# Patient Record
Sex: Female | Born: 1960 | Race: White | Hispanic: No | Marital: Married | Smoking: Never smoker
Health system: Southern US, Community
[De-identification: ages and names within clinical notes are randomized; demographics above are authoritative.]

## PROBLEM LIST (undated history)

## (undated) DIAGNOSIS — Q225 Ebstein's anomaly: Secondary | ICD-10-CM

## (undated) DIAGNOSIS — Z9889 Other specified postprocedural states: Secondary | ICD-10-CM

## (undated) DIAGNOSIS — A498 Other bacterial infections of unspecified site: Secondary | ICD-10-CM

## (undated) DIAGNOSIS — K589 Irritable bowel syndrome without diarrhea: Secondary | ICD-10-CM

## (undated) DIAGNOSIS — T8859XA Other complications of anesthesia, initial encounter: Secondary | ICD-10-CM

## (undated) DIAGNOSIS — E7800 Pure hypercholesterolemia, unspecified: Secondary | ICD-10-CM

## (undated) DIAGNOSIS — R112 Nausea with vomiting, unspecified: Secondary | ICD-10-CM

## (undated) DIAGNOSIS — I10 Essential (primary) hypertension: Secondary | ICD-10-CM

## (undated) DIAGNOSIS — N3010 Interstitial cystitis (chronic) without hematuria: Secondary | ICD-10-CM

## (undated) HISTORY — PX: HAND SURGERY: SHX662

## (undated) HISTORY — PX: ABDOMINAL HYSTERECTOMY: SHX81

## (undated) HISTORY — PX: APPENDECTOMY: SHX54

---

## 1997-12-22 ENCOUNTER — Other Ambulatory Visit: Admission: RE | Admit: 1997-12-22 | Discharge: 1997-12-22 | Payer: Self-pay | Admitting: Obstetrics and Gynecology

## 1998-07-26 ENCOUNTER — Encounter: Payer: Self-pay | Admitting: Emergency Medicine

## 1998-07-26 ENCOUNTER — Emergency Department (HOSPITAL_COMMUNITY): Admission: EM | Admit: 1998-07-26 | Discharge: 1998-07-26 | Payer: Self-pay | Admitting: Emergency Medicine

## 1998-07-26 ENCOUNTER — Inpatient Hospital Stay (HOSPITAL_COMMUNITY): Admission: RE | Admit: 1998-07-26 | Discharge: 1998-07-29 | Payer: Self-pay | Admitting: Urology

## 1998-07-27 ENCOUNTER — Encounter: Payer: Self-pay | Admitting: Urology

## 1998-07-28 ENCOUNTER — Encounter: Payer: Self-pay | Admitting: Urology

## 1998-07-29 ENCOUNTER — Encounter: Payer: Self-pay | Admitting: Urology

## 1998-08-08 ENCOUNTER — Inpatient Hospital Stay (HOSPITAL_COMMUNITY): Admission: EM | Admit: 1998-08-08 | Discharge: 1998-08-10 | Payer: Self-pay | Admitting: Emergency Medicine

## 1998-08-08 ENCOUNTER — Encounter: Payer: Self-pay | Admitting: Urology

## 1998-08-08 ENCOUNTER — Encounter: Payer: Self-pay | Admitting: Emergency Medicine

## 1998-08-09 ENCOUNTER — Encounter: Payer: Self-pay | Admitting: Urology

## 1998-08-14 ENCOUNTER — Ambulatory Visit (HOSPITAL_COMMUNITY): Admission: RE | Admit: 1998-08-14 | Discharge: 1998-08-14 | Payer: Self-pay | Admitting: Obstetrics and Gynecology

## 1999-04-18 ENCOUNTER — Other Ambulatory Visit: Admission: RE | Admit: 1999-04-18 | Discharge: 1999-04-18 | Payer: Self-pay | Admitting: Obstetrics and Gynecology

## 1999-05-27 ENCOUNTER — Encounter: Payer: Self-pay | Admitting: Urology

## 1999-05-27 ENCOUNTER — Encounter (INDEPENDENT_AMBULATORY_CARE_PROVIDER_SITE_OTHER): Payer: Self-pay | Admitting: Specialist

## 1999-05-27 ENCOUNTER — Ambulatory Visit (HOSPITAL_COMMUNITY): Admission: RE | Admit: 1999-05-27 | Discharge: 1999-05-27 | Payer: Self-pay | Admitting: Urology

## 2000-05-08 ENCOUNTER — Other Ambulatory Visit: Admission: RE | Admit: 2000-05-08 | Discharge: 2000-05-08 | Payer: Self-pay | Admitting: Obstetrics and Gynecology

## 2000-06-04 ENCOUNTER — Ambulatory Visit (HOSPITAL_BASED_OUTPATIENT_CLINIC_OR_DEPARTMENT_OTHER): Admission: RE | Admit: 2000-06-04 | Discharge: 2000-06-04 | Payer: Self-pay | Admitting: Surgery

## 2000-06-11 ENCOUNTER — Emergency Department (HOSPITAL_COMMUNITY): Admission: EM | Admit: 2000-06-11 | Discharge: 2000-06-11 | Payer: Self-pay | Admitting: *Deleted

## 2000-06-13 ENCOUNTER — Encounter: Payer: Self-pay | Admitting: Urology

## 2000-06-13 ENCOUNTER — Encounter: Admission: RE | Admit: 2000-06-13 | Discharge: 2000-06-13 | Payer: Self-pay | Admitting: Urology

## 2000-07-02 ENCOUNTER — Ambulatory Visit (HOSPITAL_COMMUNITY): Admission: RE | Admit: 2000-07-02 | Discharge: 2000-07-02 | Payer: Self-pay | Admitting: Gastroenterology

## 2000-08-09 ENCOUNTER — Ambulatory Visit (HOSPITAL_COMMUNITY): Admission: RE | Admit: 2000-08-09 | Discharge: 2000-08-09 | Payer: Self-pay | Admitting: Gastroenterology

## 2000-08-09 ENCOUNTER — Encounter: Payer: Self-pay | Admitting: Gastroenterology

## 2000-11-28 ENCOUNTER — Encounter: Admission: RE | Admit: 2000-11-28 | Discharge: 2000-11-28 | Payer: Self-pay | Admitting: Urology

## 2000-11-28 ENCOUNTER — Encounter: Payer: Self-pay | Admitting: Urology

## 2001-05-28 ENCOUNTER — Other Ambulatory Visit: Admission: RE | Admit: 2001-05-28 | Discharge: 2001-05-28 | Payer: Self-pay | Admitting: Obstetrics and Gynecology

## 2002-06-30 ENCOUNTER — Other Ambulatory Visit: Admission: RE | Admit: 2002-06-30 | Discharge: 2002-06-30 | Payer: Self-pay | Admitting: Obstetrics and Gynecology

## 2003-08-13 ENCOUNTER — Other Ambulatory Visit: Admission: RE | Admit: 2003-08-13 | Discharge: 2003-08-13 | Payer: Self-pay | Admitting: Obstetrics and Gynecology

## 2004-09-15 ENCOUNTER — Other Ambulatory Visit: Admission: RE | Admit: 2004-09-15 | Discharge: 2004-09-15 | Payer: Self-pay | Admitting: Obstetrics and Gynecology

## 2005-05-18 ENCOUNTER — Encounter: Admission: RE | Admit: 2005-05-18 | Discharge: 2005-05-18 | Payer: Self-pay | Admitting: Emergency Medicine

## 2005-05-30 ENCOUNTER — Emergency Department (HOSPITAL_COMMUNITY): Admission: EM | Admit: 2005-05-30 | Discharge: 2005-05-30 | Payer: Self-pay | Admitting: Emergency Medicine

## 2005-06-01 ENCOUNTER — Ambulatory Visit (HOSPITAL_COMMUNITY): Admission: RE | Admit: 2005-06-01 | Discharge: 2005-06-01 | Payer: Self-pay | Admitting: Emergency Medicine

## 2005-06-12 ENCOUNTER — Encounter: Admission: RE | Admit: 2005-06-12 | Discharge: 2005-06-12 | Payer: Self-pay | Admitting: Neurology

## 2008-09-01 ENCOUNTER — Encounter: Admission: RE | Admit: 2008-09-01 | Discharge: 2008-09-01 | Payer: Self-pay | Admitting: Urology

## 2009-03-20 ENCOUNTER — Emergency Department (HOSPITAL_COMMUNITY): Admission: EM | Admit: 2009-03-20 | Discharge: 2009-03-20 | Payer: Self-pay | Admitting: Emergency Medicine

## 2010-08-25 LAB — URINALYSIS, ROUTINE W REFLEX MICROSCOPIC
Bilirubin Urine: NEGATIVE
Glucose, UA: NEGATIVE mg/dL
Ketones, ur: NEGATIVE mg/dL
Nitrite: NEGATIVE
Specific Gravity, Urine: 1.014 (ref 1.005–1.030)
pH: 8 (ref 5.0–8.0)

## 2010-08-25 LAB — URINE CULTURE: Colony Count: >=100000

## 2010-08-25 LAB — BASIC METABOLIC PANEL
BUN: 9 mg/dL (ref 6–23)
CO2: 25 mEq/L (ref 19–32)
Chloride: 108 mEq/L (ref 96–112)
Creatinine, Ser: 0.82 mg/dL (ref 0.4–1.2)
Potassium: 3.9 mEq/L (ref 3.5–5.1)

## 2010-08-25 LAB — DIFFERENTIAL
Basophils Relative: 0 % (ref 0–1)
Eosinophils Absolute: 0 10*3/uL (ref 0.0–0.7)
Eosinophils Relative: 0 % (ref 0–5)
Lymphs Abs: 0.3 10*3/uL — ABNORMAL LOW (ref 0.7–4.0)
Monocytes Relative: 7 % (ref 3–12)

## 2010-08-25 LAB — CBC
HCT: 32.6 % — ABNORMAL LOW (ref 36.0–46.0)
MCHC: 33 g/dL (ref 30.0–36.0)
MCV: 89.4 fL (ref 78.0–100.0)
Platelets: 95 10*3/uL — ABNORMAL LOW (ref 150–400)
WBC: 6.4 10*3/uL (ref 4.0–10.5)

## 2010-08-25 LAB — URINE MICROSCOPIC-ADD ON

## 2010-10-07 NOTE — Op Note (Signed)
[place]  Patient:    Christina Singleton, Christina Singleton                      MRN: [id number] Proc. Date: 06/04/00 Adm. Date:  06/04/00 Attending:  Abigail Miyamoto, M.D.                           Operative Report  PREOPERATIVE DIAGNOSIS:  Nodule right axillae.  POSTOPERATIVE DIAGNOSIS:  Dilated vein right axillae.  PROCEDURE:  Exploration of nodule right axillae.  SURGEON:  Dr. Abigail Miyamoto.  ANESTHESIA:  1% lidocaine and monitored anesthesia care.  ESTIMATED BLOOD LOSS:  Minimal.  INDICATIONS:  Christina Singleton is a 50 year old female who presented with a nodule in the axillae, which was more on the very proximal medial right arm. The mass was very superficial and soft, and could only be seen with the patient sitting upright and not when she was laying down.  She requested removal of this nodule.  FINDINGS:  The patient was found instead of a nodule to have a dilated vein which was left in place.  I could not be certain whether this was a communicating vein or a deep vein giving its location in the axillae.  PROCEDURE IN DETAIL:  The patient was brought to the operating room and identified at [employer].  She was placed supine on the operating room table and anesthesia was induced.  Her right axillae was then prepped and draped in the usual sterile fashion.  The skin overlying the nodule was then anesthetized with 1% lidocaine.  Incision was carried down to the nodule with the scalpel.  The nodule was then examined closer with the aid of dissection with a hemostat.  It then became apparent that this was a small dilated vein. No other abnormalities could be palpated through the incision.  At this point decision was made to leave the vein in place.  Subcutaneous layer was then closed with an interrupted 3-0 Vicryl suture, and the skin was closed with a running 4-0 monacryl.  Steri-Strips, gauze, and tape were then applied.  The patient tolerated  the procedure well.  All sponge, needle and instrument counts were correct at the end of the procedure.  The patient was taken in stable condition from the operating room to the recovery room. DD:  06/04/00 TD:  06/04/00 Job: [id number]

## 2010-10-11 ENCOUNTER — Other Ambulatory Visit: Payer: Self-pay | Admitting: Obstetrics and Gynecology

## 2010-10-11 DIAGNOSIS — R928 Other abnormal and inconclusive findings on diagnostic imaging of breast: Secondary | ICD-10-CM

## 2010-10-14 ENCOUNTER — Ambulatory Visit
Admission: RE | Admit: 2010-10-14 | Discharge: 2010-10-14 | Disposition: A | Discharge status: Home / Self Care | Payer: BC Managed Care – PPO | Source: Ambulatory Visit | Attending: Obstetrics and Gynecology | Admitting: Obstetrics and Gynecology

## 2010-10-14 DIAGNOSIS — R928 Other abnormal and inconclusive findings on diagnostic imaging of breast: Secondary | ICD-10-CM

## 2011-02-16 ENCOUNTER — Emergency Department (HOSPITAL_COMMUNITY)
Admission: EM | Admit: 2011-02-16 | Discharge: 2011-02-17 | Disposition: A | Discharge status: Home / Self Care | Payer: BC Managed Care – PPO | Attending: Emergency Medicine | Admitting: Emergency Medicine

## 2011-02-16 DIAGNOSIS — R079 Chest pain, unspecified: Secondary | ICD-10-CM | POA: Insufficient documentation

## 2011-02-16 DIAGNOSIS — R112 Nausea with vomiting, unspecified: Secondary | ICD-10-CM | POA: Insufficient documentation

## 2011-02-16 DIAGNOSIS — N390 Urinary tract infection, site not specified: Secondary | ICD-10-CM | POA: Insufficient documentation

## 2011-02-16 DIAGNOSIS — E860 Dehydration: Secondary | ICD-10-CM | POA: Insufficient documentation

## 2011-02-16 DIAGNOSIS — I10 Essential (primary) hypertension: Secondary | ICD-10-CM | POA: Insufficient documentation

## 2011-02-16 DIAGNOSIS — R51 Headache: Secondary | ICD-10-CM | POA: Insufficient documentation

## 2011-02-17 ENCOUNTER — Emergency Department (HOSPITAL_COMMUNITY): Payer: BC Managed Care – PPO

## 2011-02-17 ENCOUNTER — Encounter (HOSPITAL_COMMUNITY): Payer: Self-pay

## 2011-02-17 LAB — URINALYSIS, ROUTINE W REFLEX MICROSCOPIC
Glucose, UA: NEGATIVE mg/dL
Protein, ur: NEGATIVE mg/dL
Specific Gravity, Urine: 1.016 (ref 1.005–1.030)
pH: 7.5 (ref 5.0–8.0)

## 2011-02-17 LAB — COMPREHENSIVE METABOLIC PANEL
Albumin: 3.7 g/dL (ref 3.5–5.2)
BUN: 10 mg/dL (ref 6–23)
Calcium: 9.4 mg/dL (ref 8.4–10.5)
Creatinine, Ser: 0.69 mg/dL (ref 0.50–1.10)
Total Bilirubin: 0.1 mg/dL — ABNORMAL LOW (ref 0.3–1.2)
Total Protein: 6.9 g/dL (ref 6.0–8.3)

## 2011-02-17 LAB — DIFFERENTIAL
Eosinophils Absolute: 0 10*3/uL (ref 0.0–0.7)
Eosinophils Relative: 0 % (ref 0–5)
Lymphocytes Relative: 13 % (ref 12–46)
Lymphs Abs: 1.6 10*3/uL (ref 0.7–4.0)
Monocytes Absolute: 0.7 10*3/uL (ref 0.1–1.0)

## 2011-02-17 LAB — CBC
HCT: 38.5 % (ref 36.0–46.0)
MCH: 29.2 pg (ref 26.0–34.0)
MCHC: 34 g/dL (ref 30.0–36.0)
MCV: 85.9 fL (ref 78.0–100.0)
RDW: 14.9 % (ref 11.5–15.5)

## 2011-02-17 LAB — URINE MICROSCOPIC-ADD ON

## 2011-04-22 ENCOUNTER — Encounter (HOSPITAL_COMMUNITY): Payer: Self-pay | Admitting: *Deleted

## 2011-04-22 ENCOUNTER — Emergency Department (HOSPITAL_COMMUNITY): Payer: BC Managed Care – PPO

## 2011-04-22 ENCOUNTER — Emergency Department (HOSPITAL_COMMUNITY)
Admission: EM | Admit: 2011-04-22 | Discharge: 2011-04-22 | Disposition: A | Discharge status: Other Acute Inpatient Hospital | Payer: BC Managed Care – PPO

## 2011-04-22 ENCOUNTER — Inpatient Hospital Stay (HOSPITAL_COMMUNITY)
Admission: EM | Admit: 2011-04-22 | Discharge: 2011-04-25 | DRG: 229 | Disposition: A | Discharge status: Home / Self Care | Payer: BC Managed Care – PPO | Source: Ambulatory Visit | Attending: General Surgery | Admitting: General Surgery

## 2011-04-22 DIAGNOSIS — L03119 Cellulitis of unspecified part of limb: Secondary | ICD-10-CM | POA: Diagnosis present

## 2011-04-22 DIAGNOSIS — J45909 Unspecified asthma, uncomplicated: Secondary | ICD-10-CM | POA: Diagnosis present

## 2011-04-22 DIAGNOSIS — L02519 Cutaneous abscess of unspecified hand: Secondary | ICD-10-CM | POA: Diagnosis present

## 2011-04-22 DIAGNOSIS — M65839 Other synovitis and tenosynovitis, unspecified forearm: Principal | ICD-10-CM | POA: Diagnosis present

## 2011-04-22 DIAGNOSIS — Z888 Allergy status to other drugs, medicaments and biological substances status: Secondary | ICD-10-CM

## 2011-04-22 DIAGNOSIS — I10 Essential (primary) hypertension: Secondary | ICD-10-CM | POA: Diagnosis present

## 2011-04-22 DIAGNOSIS — S61219A Laceration without foreign body of unspecified finger without damage to nail, initial encounter: Secondary | ICD-10-CM

## 2011-04-22 DIAGNOSIS — Z882 Allergy status to sulfonamides status: Secondary | ICD-10-CM

## 2011-04-22 HISTORY — DX: Pure hypercholesterolemia, unspecified: E78.00

## 2011-04-22 HISTORY — DX: Irritable bowel syndrome, unspecified: K58.9

## 2011-04-22 HISTORY — DX: Other bacterial infections of unspecified site: A49.8

## 2011-04-22 HISTORY — DX: Ebstein's anomaly: Q22.5

## 2011-04-22 HISTORY — DX: Essential (primary) hypertension: I10

## 2011-04-22 LAB — LACTIC ACID, PLASMA: Lactic Acid, Venous: 1.5 mmol/L (ref 0.5–2.2)

## 2011-04-22 LAB — CBC
Hemoglobin: 13.1 g/dL (ref 12.0–15.0)
MCHC: 33.9 g/dL (ref 30.0–36.0)
Platelets: 147 10*3/uL — ABNORMAL LOW (ref 150–400)
RDW: 15.3 % (ref 11.5–15.5)

## 2011-04-22 LAB — BASIC METABOLIC PANEL
Chloride: 101 mEq/L (ref 96–112)
GFR calc Af Amer: 83 mL/min — ABNORMAL LOW (ref >90)
Potassium: 3.7 mEq/L (ref 3.5–5.1)
Sodium: 138 mEq/L (ref 135–145)

## 2011-04-22 LAB — DIFFERENTIAL
Basophils Absolute: 0 10*3/uL (ref 0.0–0.1)
Basophils Relative: 0 % (ref 0–1)
Neutro Abs: 11.4 10*3/uL — ABNORMAL HIGH (ref 1.7–7.7)
Neutrophils Relative %: 92 % — ABNORMAL HIGH (ref 43–77)

## 2011-04-22 MED ORDER — ONDANSETRON HCL 4 MG/2ML IJ SOLN
4.0000 mg | Freq: Once | INTRAMUSCULAR | Status: AC
  Administered 2011-04-22: 4 mg via INTRAVENOUS
  Filled 2011-04-22: qty 2

## 2011-04-22 MED ORDER — AMLODIPINE BESYLATE 10 MG PO TABS
10.0000 mg | ORAL_TABLET | Freq: Every day | ORAL | Status: DC
  Administered 2011-04-24 – 2011-04-25 (×2): 10 mg via ORAL
  Filled 2011-04-22 (×3): qty 1

## 2011-04-22 MED ORDER — ONDANSETRON HCL 4 MG/2ML IJ SOLN
4.0000 mg | Freq: Once | INTRAMUSCULAR | Status: AC
  Administered 2011-04-22: 4 mg via INTRAVENOUS

## 2011-04-22 MED ORDER — PANTOPRAZOLE SODIUM 40 MG PO TBEC
40.0000 mg | DELAYED_RELEASE_TABLET | Freq: Every day | ORAL | Status: DC
  Administered 2011-04-24 – 2011-04-25 (×2): 40 mg via ORAL
  Filled 2011-04-22: qty 1

## 2011-04-22 MED ORDER — ALBUTEROL SULFATE HFA 108 (90 BASE) MCG/ACT IN AERS: 2.0000 | INHALATION_SPRAY | Freq: Four times a day (QID) | RESPIRATORY_TRACT | Status: DC | PRN

## 2011-04-22 MED ORDER — KETOROLAC TROMETHAMINE 30 MG/ML IJ SOLN
INTRAMUSCULAR | Status: AC
  Administered 2011-04-22: 30 mg via INTRAVENOUS
  Filled 2011-04-22: qty 1

## 2011-04-22 MED ORDER — VANCOMYCIN HCL 1000 MG IV SOLR
750.0000 mg | INTRAVENOUS | Status: AC
  Administered 2011-04-22: 750 mg via INTRAVENOUS
  Filled 2011-04-22: qty 750

## 2011-04-22 MED ORDER — DIPHENHYDRAMINE HCL 25 MG PO TABS
25.0000 mg | ORAL_TABLET | ORAL | Status: DC
  Administered 2011-04-22 – 2011-04-25 (×8): 25 mg via ORAL
  Filled 2011-04-22 (×25): qty 1

## 2011-04-22 MED ORDER — KETOROLAC TROMETHAMINE 30 MG/ML IJ SOLN
30.0000 mg | Freq: Once | INTRAMUSCULAR | Status: AC
  Administered 2011-04-22: 30 mg via INTRAVENOUS
  Filled 2011-04-22: qty 1

## 2011-04-22 MED ORDER — FLUOXETINE HCL 40 MG PO CAPS: 40.0000 mg | ORAL_CAPSULE | Freq: Every day | ORAL | Status: DC

## 2011-04-22 MED ORDER — KCL IN DEXTROSE-NACL 20-5-0.45 MEQ/L-%-% IV SOLN
INTRAVENOUS | Status: DC
  Administered 2011-04-22: 20:00:00 via INTRAVENOUS
  Administered 2011-04-23: 20 mL/h via INTRAVENOUS
  Filled 2011-04-22 (×3): qty 1000

## 2011-04-22 MED ORDER — SODIUM CHLORIDE 0.9 % IV BOLUS (SEPSIS)
1000.0000 mL | Freq: Once | INTRAVENOUS | Status: AC
  Administered 2011-04-22: 1000 mL via INTRAVENOUS

## 2011-04-22 MED ORDER — ONDANSETRON HCL 4 MG/2ML IJ SOLN
4.0000 mg | Freq: Four times a day (QID) | INTRAMUSCULAR | Status: DC | PRN
  Filled 2011-04-22: qty 2

## 2011-04-22 MED ORDER — KETOROLAC TROMETHAMINE 30 MG/ML IJ SOLN
30.0000 mg | Freq: Once | INTRAMUSCULAR | Status: AC
  Administered 2011-04-22: 30 mg via INTRAVENOUS

## 2011-04-22 MED ORDER — HYDROCODONE-ACETAMINOPHEN 5-325 MG PO TABS
1.0000 | ORAL_TABLET | ORAL | Status: DC | PRN
  Filled 2011-04-22: qty 2

## 2011-04-22 MED ORDER — ONDANSETRON HCL 4 MG/2ML IJ SOLN
INTRAMUSCULAR | Status: AC
  Administered 2011-04-22: 4 mg via INTRAVENOUS
  Filled 2011-04-22: qty 2

## 2011-04-22 MED ORDER — BUPROPION HCL ER (SR) 150 MG PO TB12
150.0000 mg | ORAL_TABLET | Freq: Two times a day (BID) | ORAL | Status: DC
  Administered 2011-04-24 – 2011-04-25 (×3): 150 mg via ORAL
  Filled 2011-04-22 (×6): qty 1

## 2011-04-22 MED ORDER — SODIUM CHLORIDE 0.9 % IV SOLN: Freq: Once | INTRAVENOUS | Status: AC

## 2011-04-22 MED ORDER — HYDROCODONE-ACETAMINOPHEN 7.5-325 MG PO TABS: 1.0000 | ORAL_TABLET | ORAL | Status: DC | PRN

## 2011-04-22 MED ORDER — FLUOXETINE HCL 20 MG PO CAPS
40.0000 mg | ORAL_CAPSULE | Freq: Every day | ORAL | Status: DC
  Administered 2011-04-24 – 2011-04-25 (×2): 40 mg via ORAL
  Filled 2011-04-22 (×4): qty 2

## 2011-04-22 MED ORDER — ESTRADIOL 1 MG PO TABS
1.0000 mg | ORAL_TABLET | Freq: Every day | ORAL | Status: DC
  Administered 2011-04-24 – 2011-04-25 (×2): 1 mg via ORAL
  Filled 2011-04-22 (×3): qty 1

## 2011-04-22 MED ORDER — MONTELUKAST SODIUM 10 MG PO TABS
10.0000 mg | ORAL_TABLET | Freq: Every day | ORAL | Status: DC
  Administered 2011-04-22 – 2011-04-24 (×4): 10 mg via ORAL
  Filled 2011-04-22 (×4): qty 1

## 2011-04-22 MED ORDER — SODIUM CHLORIDE 0.9 % IV SOLN
750.0000 mg | Freq: Two times a day (BID) | INTRAVENOUS | Status: DC
  Filled 2011-04-22: qty 750

## 2011-04-22 NOTE — ED Notes (Signed)
Pt noted w/good sensation to fingers to left hand - bandage noted to hand has remained intact - no drainage noted

## 2011-04-22 NOTE — ED Notes (Signed)
Pt states she cut her hand on a plate on Thursday now she is having severe pain and fingers are swelled up.  Swelling just started this am pt reports.

## 2011-04-22 NOTE — ED Provider Notes (Signed)
History     CSN: [id number] Arrival date & time: 04/22/2011  2:04 PM   First MD Initiated Contact with Patient 04/22/11 1439      Chief Complaint  Patient presents with  . Nausea  . Weakness  . Emesis  . Joint Swelling  . Hand Pain    (Consider location/radiation/quality/duration/timing/severity/associated sxs/prior treatment) HPI 50 year old otherwise healthy woman who presents with name and swelling over the left index finger.  Pt cut the finger picking up a broken plate on Thursday evening.  Felt fine Friday with good ROM and no pain, but awoke this a.m. with redness, swelling, and pain on the dorsal and ventral surfaces of the hand.  She has been feeling cold and has vomited three times.  She is in significant pain. She went initially to urgent care, who advised her to come to the emergency room for evaluation.  Past Medical History  Diagnosis Date  . IBS (irritable bowel syndrome)   . Hypertension   . Ebsteins anomaly   . Migraine   . 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 Hypercholesteremia   . Asthma     Past Surgical History  Procedure Date  . Appendectomy   . Abdominal hysterectomy     History reviewed. No pertinent family history.  History  Substance Use Topics  . Smoking status: Never Smoker   . Smokeless tobacco: Not on file  . Alcohol Use: 0.6 oz/week    1 Glasses of wine per week    OB History    Grav Para Term Preterm Abortions TAB SAB Ect Mult Living                  Review of Systems  Allergies  Sulfa antibiotics and Zithromax  Home Medications  No current outpatient prescriptions on file.  BP 86/54  Pulse 60  Temp(Src) 99.9 F (37.7 C) (Oral)  Resp 20  SpO2 100%  Physical Exam VItal signs reviewed and stable. GEN: anxious appearing, jittery L Hand: erythema, edema, and warmth centered around the L index finger.  Very TTP and motion.  Laceration 3-55mm observed on ventral surface at MCP.  ED Course    Procedures (including critical care time)   Labs Reviewed  CBC  DIFFERENTIAL  BASIC METABOLIC PANEL   No results found.   No diagnosis found.    MDM  50 yo c acute cellulitis of L hand 2/2 small laceration.  Concern for tendon damage vs tendon shealth infection.  X ray pending, fluids, abx, zofran/torodol ordered.  Dr. Izora Ribas, on call for hand surg, consulted and at bedside.  Wants to perform I/D in ED, to OR if unsuccessful.        Kathreen Cosier, MD 04/22/11 [phone]

## 2011-04-22 NOTE — ED Notes (Signed)
Pt states does not want narcotic meds.  States would rather have Toradol.  States is not allergic but states "I do not like the way my body reacts to them."   Also, this RN has spoken with pharmacy x 3 re: pt's meds and IV fluids - continuing to wait.

## 2011-04-22 NOTE — Progress Notes (Signed)
ANTIBIOTIC CONSULT NOTE - INITIAL  Pharmacy Consult for vancomycin Indication: cellulitis  Allergies  Allergen Reactions  . Sulfa Antibiotics Other (See Comments)    hallucinations  . Zithromax (Azithromycin) Other (See Comments)    Made her feel weird    Patient Measurements: Height: 5' 10.5" (179.1 cm) Weight: 140 lb (63.504 kg) IBW/kg (Calculated) : 69.65   Vital Signs: Temp: 99.9 F (37.7 C) (12/01 1404) Temp src: Oral (12/01 1404) BP: 120/69 mmHg (12/01 1525) Pulse Rate: 66  (12/01 1525)  Labs:  Basename 04/22/11 1456  WBC 12.5*  HGB 13.1  PLT 147*  LABCREA --  CREATININE 0.91   Estimated Creatinine Clearance: 73.3 ml/min (by C-G formula based on Cr of 0.91).   Medical History: Past Medical History  Diagnosis Date  . IBS (irritable bowel syndrome)   . Hypertension   . Ebsteins anomaly   . Migraine   . 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 Hypercholesteremia   . Asthma     Assessment: 50 yo female with cellulitis of L hand and to start on. vancomycin   Goal of Therapy:  Vancomycin trough level 10-15 mcg/ml  Plan Will give vancomycin 750mg  IV q12hr and follow renal function and order levels as needed.  Benny Lennert 04/22/2011,4:07 PM

## 2011-04-22 NOTE — H&P (Signed)
Christina Singleton is an 50 y.o. female.   Chief Complaint: I cut my hand HPI: cut left palm on Thursday, this am hand more painful, swollen, has been using abx ointment at home  Past Medical History  Diagnosis Date  . IBS (irritable bowel syndrome)   . Hypertension   . Ebsteins anomaly   . Migraine   . 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 Hypercholesteremia   . Asthma     Past Surgical History  Procedure Date  . Appendectomy   . Abdominal hysterectomy     History reviewed. No pertinent family history. Social History:  reports that she has never smoked. She does not have any smokeless tobacco history on file. She reports that she drinks about .6 ounces of alcohol per week. She reports that she does not use illicit drugs.  Allergies:  Allergies  Allergen Reactions  . Sulfa Antibiotics Other (See Comments)    hallucinations  . Zithromax (Azithromycin) Other (See Comments)    Made her feel weird    Medications Prior to Admission  Medication Dose Route Frequency Provider Last Rate Last Dose  . sodium chloride 0.9 % bolus 1,000 mL  1,000 mL Intravenous Once Kathreen Cosier, MD   1,000 mL at 04/22/11 1509   Followed by  . 0.9 %  sodium chloride infusion   Intravenous Once Kathreen Cosier, MD      . ketorolac (TORADOL) 30 MG/ML injection 30 mg  30 mg Intravenous Once Kathreen Cosier, MD   30 mg at 04/22/11 1507  . ondansetron (ZOFRAN) injection 4 mg  4 mg Intravenous Once Kathreen Cosier, MD   4 mg at 04/22/11 1506   Followed by  . ondansetron (ZOFRAN) injection 4 mg  4 mg Intravenous Q6H PRN Kathreen Cosier, MD      . vancomycin (VANCOCIN) 750 mg in sodium chloride 0.9 % 150 mL IVPB  750 mg Intravenous NOW Benny Lennert, PHARMD      . vancomycin (VANCOCIN) 750 mg in sodium chloride 0.9 % 150 mL IVPB  750 mg Intravenous Q12H Benny Lennert, [facility]       No current outpatient prescriptions on file as of 04/22/2011.     Results for orders placed during the hospital encounter of 04/22/11 (from the past 48 hour(s))  CBC     Status: Abnormal   Collection Time   04/22/11  2:56 PM      Component Value Range Comment   WBC 12.5 (*) 4.0 - 10.5 (K/uL)    RBC 4.60  3.87 - 5.11 (MIL/uL)    Hemoglobin 13.1  12.0 - 15.0 (g/dL)    HCT 96.0  45.4 - 09.8 (%)    MCV 83.9  78.0 - 100.0 (fL)    MCH 28.5  26.0 - 34.0 (pg)    MCHC 33.9  30.0 - 36.0 (g/dL)    RDW 11.9  14.7 - 82.9 (%)    Platelets 147 (*) 150 - 400 (K/uL)   DIFFERENTIAL     Status: Abnormal   Collection Time   04/22/11  2:56 PM      Component Value Range Comment   Neutrophils Relative 92 (*) 43 - 77 (%)    Neutro Abs 11.4 (*) 1.7 - 7.7 (K/uL)    Lymphocytes Relative 4 (*) 12 - 46 (%)    Lymphs Abs 0.5 (*) 0.7 - 4.0 (K/uL)    Monocytes Relative 4  3 - 12 (%)    Monocytes Absolute 0.6  0.1 - 1.0 (K/uL)    Eosinophils Relative 0  0 - 5 (%)    Eosinophils Absolute 0.0  0.0 - 0.7 (K/uL)    Basophils Relative 0  0 - 1 (%)    Basophils Absolute 0.0  0.0 - 0.1 (K/uL)   BASIC METABOLIC PANEL     Status: Abnormal   Collection Time   04/22/11  2:56 PM      Component Value Range Comment   Sodium 138  135 - 145 (mEq/L)    Potassium 3.7  3.5 - 5.1 (mEq/L)    Chloride 101  96 - 112 (mEq/L)    CO2 25  19 - 32 (mEq/L)    Glucose, Bld 95  70 - 99 (mg/dL)    BUN 11  6 - 23 (mg/dL)    Creatinine, Ser 1.61  0.50 - 1.10 (mg/dL)    Calcium 9.5  8.4 - 10.5 (mg/dL)    GFR calc non Af Amer 72 (*) >90 (mL/min)    GFR calc Af Amer 83 (*) >90 (mL/min)   LACTIC ACID, PLASMA     Status: Normal   Collection Time   04/22/11  2:57 PM      Component Value Range Comment   Lactic Acid, Venous 1.5  0.5 - 2.2 (mmol/L)    No results found.  Review of Systems  Constitutional: Positive for fever and chills.  Eyes: Negative.   Respiratory: Negative.   Cardiovascular: Negative.   Gastrointestinal: Positive for nausea.  Genitourinary: Negative.   Musculoskeletal: Positive  for joint pain.  Skin: Positive for rash.  Neurological: Negative.   Endo/Heme/Allergies: Negative.   Psychiatric/Behavioral: Negative.     Blood pressure 120/69, pulse 66, temperature 99.9 F (37.7 C), temperature source Oral, resp. rate 20, height 5' 10.5" (1.791 m), weight 63.504 kg (140 lb), SpO2 99.00%. Physical Exam  Constitutional: She is oriented to person, place, and time. She appears well-developed and well-nourished.  HENT:  Head: Normocephalic and atraumatic.  Eyes: Conjunctivae are normal.  Neck: Neck supple.  Cardiovascular: Normal rate.   Respiratory: Effort normal.  GI: Soft.  Musculoskeletal: She exhibits edema and tenderness.       Arms: Neurological: She is alert and oriented to person, place, and time.  Skin: There is erythema.  Psychiatric: She has a normal mood and affect.     Assessment/Plan Laceration L hand, cellulitis, ? Early tenosynovitis  I&D performed , irrigation of wound, packing with iodoform gauze Will observe for 23h, remove packing in am, cont of Iv abx, may require additional i&D  Mauricio Dahlen CHRISTOPHER 04/22/2011, 4:21 PM

## 2011-04-22 NOTE — ED Provider Notes (Signed)
I saw and evaluated the patient, reviewed the resident's note and I agree with the findings and plan.   .Face to face Exam:  General:  Awake HEENT:  Atraumatic Resp:  Normal effort Abd:  Nondistended Neuro:No focal weakness Lymph: No adenopathy   Nelia Shi, MD 04/22/11 2026

## 2011-04-22 NOTE — ED Notes (Signed)
Spoke w/pharmacy re: pt's meds - advised pt will be staying in CDU tonight so meds need to be verified and sent.

## 2011-04-22 NOTE — Brief Op Note (Signed)
*   No surgery found *  4:28 PM  PATIENT:  Christina Singleton  50 y.o. female  PRE-OPERATIVE DIAGNOSIS:  Cellulitis L Hand, early tenosynovitis  POST-OPERATIVE DIAGNOSIS:  same  PROCEDURE:  I&D L palm, L4th digit, packing w/ Iodoform gauze  SURGEON:  Tulio Facundo  PHYSICIAN ASSISTANT: none ASSISTANTS: none   ANESTHESIA:   local  EBL:     BLOOD ADMINISTERED:none  DRAINS: iodoform   LOCAL MEDICATIONS USED:  LIDOCAINE 5CC  SPECIMEN:  No Specimen  DISPOSITION OF SPECIMEN:  N/A  COUNTS:  YES  TOURNIQUET:  * No surgery found *  DICTATION: .Note written in EPIC  PLAN OF CARE: Admit for overnight observation  PATIENT DISPOSITION:  cdu   Delay start of Pharmacological VTE agent (>24hrs) due to surgical blood loss or risk of bleeding:  {YES/NO/NOT APPLICABLE:20182

## 2011-04-22 NOTE — ED Provider Notes (Signed)
Dr. Izora Ribas I and D pts hand.   Pt is to stay in CDU observation overnight following the order set written by Dr. Izora Ribas. Dr. Izora Ribas will come see patient in the morning 04/23/2011, and decide if the patient will go home or if he will admit.   Dorthula Matas, PA 04/22/11 1640  Dorthula Matas, PA 04/22/11 [phone]

## 2011-04-22 NOTE — ED Notes (Signed)
Pt had hand laceration from plate on Thur.  Pt has minimal size cut.  Pt states that she woke up today with increase in pain and swelling to same. Pt then had onset of n/v.  Pt has moderate swelling and redness.  Pt appears pale in color at this time.

## 2011-04-23 ENCOUNTER — Inpatient Hospital Stay: Admit: 2011-04-23 | Payer: Self-pay | Admitting: General Surgery

## 2011-04-23 ENCOUNTER — Encounter (HOSPITAL_COMMUNITY): Payer: Self-pay

## 2011-04-23 ENCOUNTER — Emergency Department (HOSPITAL_COMMUNITY): Payer: BC Managed Care – PPO

## 2011-04-23 ENCOUNTER — Encounter (HOSPITAL_COMMUNITY)
Admission: EM | Disposition: A | Discharge status: Home / Self Care | Payer: Self-pay | Source: Ambulatory Visit | Attending: General Surgery

## 2011-04-23 HISTORY — PX: I & D EXTREMITY: SHX5045

## 2011-04-23 LAB — DIFFERENTIAL
Basophils Absolute: 0 10*3/uL (ref 0.0–0.1)
Basophils Relative: 0 % (ref 0–1)
Monocytes Absolute: 0.9 10*3/uL (ref 0.1–1.0)
Neutro Abs: 10.6 10*3/uL — ABNORMAL HIGH (ref 1.7–7.7)
Neutrophils Relative %: 87 % — ABNORMAL HIGH (ref 43–77)

## 2011-04-23 LAB — CBC
HCT: 33.7 % — ABNORMAL LOW (ref 36.0–46.0)
MCHC: 32.6 g/dL (ref 30.0–36.0)
RDW: 15.7 % — ABNORMAL HIGH (ref 11.5–15.5)

## 2011-04-23 SURGERY — IRRIGATION AND DEBRIDEMENT EXTREMITY
Anesthesia: General | Site: Hand | Laterality: Left | Wound class: Clean

## 2011-04-23 MED ORDER — GLYCOPYRROLATE 0.2 MG/ML IJ SOLN
INTRAMUSCULAR | Status: DC | PRN
  Administered 2011-04-23: 0.2 mg via INTRAVENOUS

## 2011-04-23 MED ORDER — CLINDAMYCIN PHOSPHATE 600 MG/50ML IV SOLN
600.0000 mg | Freq: Three times a day (TID) | INTRAVENOUS | Status: DC
  Administered 2011-04-23 – 2011-04-25 (×6): 600 mg via INTRAVENOUS
  Filled 2011-04-23 (×12): qty 50

## 2011-04-23 MED ORDER — INFLUENZA VIRUS VACC SPLIT PF IM SUSP
0.5000 mL | INTRAMUSCULAR | Status: DC
  Filled 2011-04-23: qty 0.5

## 2011-04-23 MED ORDER — PNEUMOCOCCAL VAC POLYVALENT 25 MCG/0.5ML IJ INJ
0.5000 mL | INJECTION | INTRAMUSCULAR | Status: DC
  Filled 2011-04-23: qty 0.5

## 2011-04-23 MED ORDER — HYDROMORPHONE HCL PF 1 MG/ML IJ SOLN
0.2500 mg | INTRAMUSCULAR | Status: DC | PRN
  Administered 2011-04-23: 0.5 mg via INTRAVENOUS
  Administered 2011-04-23 (×2): 0.25 mg via INTRAVENOUS

## 2011-04-23 MED ORDER — METOCLOPRAMIDE HCL 10 MG PO TABS: 5.0000 mg | ORAL_TABLET | Freq: Three times a day (TID) | ORAL | Status: DC | PRN

## 2011-04-23 MED ORDER — FENTANYL CITRATE 0.05 MG/ML IJ SOLN
INTRAMUSCULAR | Status: DC | PRN
  Administered 2011-04-23: 100 ug via INTRAVENOUS

## 2011-04-23 MED ORDER — MIDAZOLAM HCL 5 MG/5ML IJ SOLN
INTRAMUSCULAR | Status: DC | PRN
  Administered 2011-04-23: 2 mg via INTRAVENOUS

## 2011-04-23 MED ORDER — ONDANSETRON HCL 4 MG/2ML IJ SOLN
INTRAMUSCULAR | Status: DC | PRN
  Administered 2011-04-23 (×2): 4 mg via INTRAVENOUS

## 2011-04-23 MED ORDER — SODIUM CHLORIDE 0.9 % IR SOLN
Status: DC | PRN
  Administered 2011-04-23: 1000 mL

## 2011-04-23 MED ORDER — OXYCODONE HCL 5 MG PO TABS: 5.0000 mg | ORAL_TABLET | ORAL | Status: DC | PRN

## 2011-04-23 MED ORDER — BUPIVACAINE HCL (PF) 0.25 % IJ SOLN
INTRAMUSCULAR | Status: DC | PRN
  Administered 2011-04-23: 9 mL

## 2011-04-23 MED ORDER — SCOPOLAMINE 1 MG/3DAYS TD PT72
1.0000 | MEDICATED_PATCH | Freq: Once | TRANSDERMAL | Status: DC
  Filled 2011-04-23: qty 1

## 2011-04-23 MED ORDER — KETOROLAC TROMETHAMINE 30 MG/ML IJ SOLN
30.0000 mg | Freq: Four times a day (QID) | INTRAMUSCULAR | Status: AC | PRN
  Administered 2011-04-23 – 2011-04-24 (×4): 30 mg via INTRAVENOUS
  Filled 2011-04-23 (×6): qty 1

## 2011-04-23 MED ORDER — MORPHINE SULFATE 2 MG/ML IJ SOLN: 2.0000 mg | INTRAMUSCULAR | Status: DC | PRN

## 2011-04-23 MED ORDER — PROMETHAZINE HCL 25 MG/ML IJ SOLN: 6.2500 mg | INTRAMUSCULAR | Status: DC | PRN

## 2011-04-23 MED ORDER — SCOPOLAMINE 1 MG/3DAYS TD PT72
MEDICATED_PATCH | TRANSDERMAL | Status: DC | PRN
  Administered 2011-04-23: 1 via TRANSDERMAL

## 2011-04-23 MED ORDER — ONDANSETRON HCL 4 MG PO TABS: 4.0000 mg | ORAL_TABLET | Freq: Four times a day (QID) | ORAL | Status: DC | PRN

## 2011-04-23 MED ORDER — PROPOFOL 10 MG/ML IV EMUL
INTRAVENOUS | Status: DC | PRN
  Administered 2011-04-23: 200 mg via INTRAVENOUS

## 2011-04-23 MED ORDER — LACTATED RINGERS IV SOLN
INTRAVENOUS | Status: DC | PRN
  Administered 2011-04-23: 13:00:00 via INTRAVENOUS

## 2011-04-23 SURGICAL SUPPLY — 41 items
BAG DECANTER FOR FLEXI CONT (MISCELLANEOUS) IMPLANT
BANDAGE ELASTIC 3 VELCRO ST LF (GAUZE/BANDAGES/DRESSINGS) IMPLANT
BANDAGE ELASTIC 4 VELCRO ST LF (GAUZE/BANDAGES/DRESSINGS) IMPLANT
BANDAGE GAUZE ELAST BULKY 4 IN (GAUZE/BANDAGES/DRESSINGS) ×2 IMPLANT
BNDG CMPR MD 5X2 ELC HKLP STRL (GAUZE/BANDAGES/DRESSINGS)
BNDG ELASTIC 2 VLCR STRL LF (GAUZE/BANDAGES/DRESSINGS) IMPLANT
CLOTH BEACON ORANGE TIMEOUT ST (SAFETY) ×2 IMPLANT
CORDS BIPOLAR (ELECTRODE) IMPLANT
CUFF TOURNIQUET SINGLE 18IN (TOURNIQUET CUFF) IMPLANT
DRAPE SURG 17X23 STRL (DRAPES) ×2 IMPLANT
ELECT REM PT RETURN 9FT ADLT (ELECTROSURGICAL)
ELECTRODE REM PT RTRN 9FT ADLT (ELECTROSURGICAL) IMPLANT
GAUZE KERLIX 2  STERILE LF (GAUZE/BANDAGES/DRESSINGS) ×1 IMPLANT
GAUZE PACKING IODOFORM 1/4X5 (PACKING) ×1 IMPLANT
GAUZE SPONGE 4X4 12PLY STRL LF (GAUZE/BANDAGES/DRESSINGS) ×1 IMPLANT
GAUZE XEROFORM 1X8 LF (GAUZE/BANDAGES/DRESSINGS) ×2 IMPLANT
GLOVE ORTHO TXT STRL SZ7.5 (GLOVE) ×2 IMPLANT
GOWN STRL NON-REIN LRG LVL3 (GOWN DISPOSABLE) ×6 IMPLANT
HANDPIECE INTERPULSE COAX TIP (DISPOSABLE)
KIT BASIN OR (CUSTOM PROCEDURE TRAY) ×2 IMPLANT
KIT ROOM TURNOVER OR (KITS) ×2 IMPLANT
MANIFOLD NEPTUNE II (INSTRUMENTS) ×2 IMPLANT
NDL HYPO 25GX1X1/2 BEV (NEEDLE) IMPLANT
NEEDLE HYPO 25GX1X1/2 BEV (NEEDLE) IMPLANT
NS IRRIG 1000ML POUR BTL (IV SOLUTION) ×2 IMPLANT
PACK ORTHO EXTREMITY (CUSTOM PROCEDURE TRAY) ×2 IMPLANT
PAD ARMBOARD 7.5X6 YLW CONV (MISCELLANEOUS) ×4 IMPLANT
PAD CAST 4YDX4 CTTN HI CHSV (CAST SUPPLIES) IMPLANT
PADDING CAST COTTON 4X4 STRL (CAST SUPPLIES)
SET HNDPC FAN SPRY TIP SCT (DISPOSABLE) IMPLANT
SOAP 2 % CHG 4 OZ (WOUND CARE) ×2 IMPLANT
SPONGE GAUZE 4X4 12PLY (GAUZE/BANDAGES/DRESSINGS) ×2 IMPLANT
SPONGE LAP 18X18 X RAY DECT (DISPOSABLE) IMPLANT
SPONGE LAP 4X18 X RAY DECT (DISPOSABLE) ×2 IMPLANT
SYR CONTROL 10ML LL (SYRINGE) IMPLANT
TOWEL OR 17X24 6PK STRL BLUE (TOWEL DISPOSABLE) ×2 IMPLANT
TOWEL OR 17X26 10 PK STRL BLUE (TOWEL DISPOSABLE) ×2 IMPLANT
TUBE ANAEROBIC SPECIMEN COL (MISCELLANEOUS) IMPLANT
TUBE CONNECTING 12X1/4 (SUCTIONS) ×2 IMPLANT
WATER STERILE IRR 1000ML POUR (IV SOLUTION) ×2 IMPLANT
YANKAUER SUCT BULB TIP NO VENT (SUCTIONS) ×2 IMPLANT

## 2011-04-23 NOTE — Transfer of Care (Signed)
Immediate Anesthesia Transfer of Care Note  Patient: Christina Singleton  Procedure(s) Performed:  IRRIGATION AND DEBRIDEMENT EXTREMITY  Patient Location: PACU  Anesthesia Type: General  Level of Consciousness: awake, alert , oriented and patient cooperative  Airway & Oxygen Therapy: Patient Spontanous Breathing  Post-op Assessment: Report given to PACU RN, Post -op Vital signs reviewed and stable and Patient moving all extremities  Post vital signs: Reviewed and stable  Complications: No apparent anesthesia complications

## 2011-04-23 NOTE — Anesthesia Procedure Notes (Signed)
Date/Time: 04/23/2011 1:16 PM Performed by: Rosita Fire Pre-anesthesia Checklist: Patient identified, Timeout performed, Emergency Drugs available, Suction available and Patient being monitored Patient Re-evaluated:Patient Re-evaluated prior to inductionPreoxygenation: Pre-oxygenation with 100% oxygen Intubation Type: IV induction Ventilation: Mask ventilation without difficulty LMA: LMA inserted LMA Size: 4.0 Placement Confirmation: positive ETCO2 and breath sounds checked- equal and bilateral

## 2011-04-23 NOTE — Anesthesia Preprocedure Evaluation (Addendum)
Anesthesia Evaluation  Patient identified by MRN, date of birth, ID band Patient awake    Reviewed: Allergy & Precautions, H&P , NPO status , Patient's Chart, lab work & pertinent test results  History of Anesthesia Complications (+) PONV  Airway Mallampati: I TM Distance: >3 FB Neck ROM: Full    Dental  (+) Teeth Intact   Pulmonary asthma ,  Uses inhaler as needed, pt states "it seems to be seasonal, I use it more in the spring and summer."   Pulmonary exam normal       Cardiovascular hypertension, Pt. on medications Regular Normal    Neuro/Psych    GI/Hepatic IBS   Endo/Other    Renal/GU      Musculoskeletal   Abdominal   Peds  Hematology   Anesthesia Other Findings   Reproductive/Obstetrics                        Anesthesia Physical Anesthesia Plan  ASA: II and Emergent  Anesthesia Plan: General   Post-op Pain Management:    Induction: Intravenous  Airway Management Planned: LMA  Additional Equipment:   Intra-op Plan:   Post-operative Plan: Extubation in OR  Informed Consent: I have reviewed the patients History and Physical, chart, labs and discussed the procedure including the risks, benefits and alternatives for the proposed anesthesia with the patient or authorized representative who has indicated his/her understanding and acceptance.   Dental advisory given  Plan Discussed with: CRNA and Surgeon  Anesthesia Plan Comments:        Anesthesia Quick Evaluation

## 2011-04-23 NOTE — ED Notes (Signed)
OR called to advise are ready for pt.  Spoke with Dr Izora Ribas who advised to obtain consent for I & D of left hand and that pt will be admitted for extended stay.

## 2011-04-23 NOTE — Op Note (Signed)
NAMEANAELLE, DUNTON             ACCOUNT NO.:  [id number]  MEDICAL RECORD NO.:  [id number]  LOCATION:  5016                         FACILITY:  [facility]  PHYSICIAN:  Johnette Abraham, MD    DATE OF BIRTH:  12-Sep-1960  DATE OF PROCEDURE:  04/23/2011 DATE OF DISCHARGE:                              OPERATIVE REPORT   PREOPERATIVE DIAGNOSIS:  Infection of the left hand and early tenosynovitis.  POSTOPERATIVE DIAGNOSIS:  Infection of the left hand and early tenosynovitis.  PROCEDURE:  Incision and drainage of abscess of the left palm and left ring finger, drainage of flexor tendon sheath.  ANESTHESIA:  General.  No specimens.  Cultures were obtained.  Postop condition stable.  ESTIMATED BLOOD LOSS:  Minimal.  INDICATIONS:  Ms. Bodin is a pleasant woman who cut her finger on Thursday, presented to the emergency room yesterday.  Local I and D was performed in the emergency room.  She was placed on antibiotics.  Repeat white count was still elevated.  Her hand was still significantly erythematous with fear of retained infection. The patient was consented for more aggressive I and D in the operating room.  Consent was obtained.  PROCEDURE IN DETAIL:  The patient was taken to the operating room and placed supine on the operating table.  General anesthesia was administered without difficulty.  Time-out was performed.  Preoperative antibiotics were given in the emergency room.  So no additional ones were needed.  The left hand was prepped and draped in normal sterile fashion.  The arm was elevated, the tourniquet was inflated to 250 mmHg. A vertical incision was made just to the radial side of the base of the left ring finger.  Dissection was carried down only to the flexor tendon sheath.  The A1 pulley was released.  There was some turbid fluid that was noticed.  This was cultured.  The fat overlying this area appeared somewhat unhealthy and infected. There did not appear to be any  deep pocket.  The flexor tendon sheath was then opened.  There was no purulence from this, however, this was irrigated.  The entire wound was then irrigated thoroughly with irrigation solution.  Small piece of iodoform gauze was placed deep in the wound.  The skin edges were approximated with 4-0 nylon.  Tourniquet was released.  Hemostasis controlled, sterile dressing was applied.  The patient tolerated the procedure well.    Johnette Abraham, MD    HCC/MEDQ  D:  04/23/2011  T:  04/23/2011  Job:  [id number]

## 2011-04-23 NOTE — ED Notes (Signed)
Holding pt's am meds d/t NPO -Dr Izora Ribas also advised pt of no ice chips earlier this am.

## 2011-04-23 NOTE — Progress Notes (Signed)
Subjective: S/p I&D of L hand , still some pain - pt has not received abx yet today.  Objective: Vital signs in last 24 hours: Temp:  [98.7 F (37.1 C)-99.9 F (37.7 C)] 98.7 F (37.1 C) (12/02 0503) Pulse Rate:  [60-85] 61  (12/02 0503) Resp:  [20] 20  (12/02 0503) BP: (86-132)/(54-69) 115/65 mmHg (12/02 0503) SpO2:  [96 %-100 %] 96 % (12/02 0503) Weight:  [63.504 kg (140 lb)] 140 lb (63.504 kg) (12/01 1606)  WBC 12.2 from 12.5  Intake/Output from previous day:   Intake/Output this shift:     Basename 04/23/11 0448 04/22/11 1456  HGB 11.0* 13.1    Basename 04/23/11 0448 04/22/11 1456  WBC 12.2* 12.5*  RBC 4.00 4.60  HCT 33.7* 38.6  PLT 132* 147*    Basename 04/22/11 1456  NA 138  K 3.7  CL 101  CO2 25  BUN 11  CREATININE 0.91  GLUCOSE 95  CALCIUM 9.5   No results found for this basename: LABPT:2,INR:2 in the last 72 hours  Neurologically intact ABD soft Neurovascular intact Sensation intact distally Intact pulses distally Incision: scant drainage L hand still with significant erythema, no purulent drainage,   Assessment/Plan: S/p I&D L hand - cx'P; no abx yet today, still concern for continued infection  Will cont/add abx, make npo for possible OR today.   Christina Singleton CHRISTOPHER 04/23/2011, 8:21 AM

## 2011-04-23 NOTE — Anesthesia Postprocedure Evaluation (Signed)
  Anesthesia Post-op Note  Patient: Christina Singleton  Procedure(s) Performed:  IRRIGATION AND DEBRIDEMENT EXTREMITY  Patient Location: PACU  Anesthesia Type: General  Level of Consciousness: awake, alert  and oriented  Airway and Oxygen Therapy: Patient Spontanous Breathing  Post-op Pain: mild  Post-op Assessment: Post-op Vital signs reviewed, Patient's Cardiovascular Status Stable, Respiratory Function Stable, Patent Airway, No signs of Nausea or vomiting and Pain level controlled  Post-op Vital Signs: stable  Complications: No apparent anesthesia complications

## 2011-04-23 NOTE — Brief Op Note (Signed)
04/22/2011 - 04/23/2011  1:43 PM  PATIENT:  Melina Schools  50 y.o. female  PRE-OPERATIVE DIAGNOSIS:  Infection of L hand, LRF  POST-OPERATIVE DIAGNOSIS:  Same  PROCEDURE:  Procedure(s): IRRIGATION AND DEBRIDEMENT EXTREMITY; release of a1 pulley, drainage of flexor tendon sheath  SURGEON:  Surgeon(s): Raudel Bazen C Giuliano Preece  PHYSICIAN ASSISTANT:   ASSISTANTS: none   ANESTHESIA:   general  EBL:  Total I/O In: 600 [I.V.:600] Out: 5 [Blood:5]  BLOOD ADMINISTERED:none  DRAINS: none   LOCAL MEDICATIONS USED:  MARCAINE 8CC  SPECIMEN:  No Specimen  DISPOSITION OF SPECIMEN:  N/A  COUNTS:  YES  TOURNIQUET:   Total Tourniquet Time Documented: Upper Arm (Left) - 14 minutes  DICTATION: .Note written in EPIC  PLAN OF CARE: Admit to inpatient   PATIENT DISPOSITION:  PACU - hemodynamically stable.

## 2011-04-24 LAB — CBC
Hemoglobin: 10.5 g/dL — ABNORMAL LOW (ref 12.0–15.0)
MCHC: 32.8 g/dL (ref 30.0–36.0)
Platelets: 128 10*3/uL — ABNORMAL LOW (ref 150–400)

## 2011-04-24 MED ORDER — ELETRIPTAN HYDROBROMIDE 40 MG PO TABS
40.0000 mg | ORAL_TABLET | ORAL | Status: DC | PRN
  Administered 2011-04-24: 40 mg via ORAL
  Filled 2011-04-24 (×2): qty 1

## 2011-04-24 MED ORDER — INFLUENZA VIRUS VACC SPLIT PF IM SUSP: 0.5000 mL | INTRAMUSCULAR | Status: DC

## 2011-04-24 MED ORDER — PNEUMOCOCCAL VAC POLYVALENT 25 MCG/0.5ML IJ INJ: 0.5000 mL | INJECTION | INTRAMUSCULAR | Status: DC

## 2011-04-24 NOTE — Progress Notes (Signed)
S: pt feeling better, some hand pain but better O: afebrile   Clindamycin Cx's - staph ?MRSA L hand - no purulent drainage, still erythema of palmar aspect to thumb, swelling to dorsum of hand A: s/o I&D, packing  P: follow cx's, dressing changes, elevate

## 2011-04-25 ENCOUNTER — Encounter (HOSPITAL_COMMUNITY): Payer: Self-pay | Admitting: General Surgery

## 2011-04-25 MED ORDER — DOXYCYCLINE HYCLATE 100 MG PO TABS: 100.0000 mg | ORAL_TABLET | Freq: Two times a day (BID) | ORAL | Status: AC

## 2011-04-25 MED ORDER — KETOROLAC TROMETHAMINE 30 MG/ML IJ SOLN
30.0000 mg | Freq: Four times a day (QID) | INTRAMUSCULAR | Status: DC | PRN
  Administered 2011-04-25: 30 mg via INTRAVENOUS
  Filled 2011-04-25: qty 1

## 2011-04-25 NOTE — Progress Notes (Signed)
Pt feeling better, minimal hand pain Afebrile, Cx - staph, strep - sensitivity not back L hand decreased erythema and swelling A- s/p I&D L hand, finger P- stable for d/c on abx, wound care.

## 2011-04-25 NOTE — Discharge Summary (Signed)
Physician Discharge Summary  Patient ID: Christina Singleton MRN: [id number] DOB/AGE: [birth year] 50 y.o.  Admit date: 04/22/2011 Discharge date: 04/25/2011  Admission Diagnoses: Infection of L hand and finger  Discharge Diagnoses: Infection of L hand and finger Active Problems:  * No active hospital problems. *    Discharged Condition: good  Hospital Course: Pt was admitted to hospital after simple I&D of L hand/finger in ER, placed on IV abx, on POD # 1, finger and hand worse - pt taken to OR for more extensive I&D, afterwards, hand improved on IV abx, wound care.  Consults: none  Significant Diagnostic Studies: microbiology: wound culture: positive for staph, strept  Treatments: antibiotics: clindamycin and surgery: I&D L hand  Discharge Exam: Blood pressure 141/62, pulse 82, temperature 99.1 F (37.3 C), temperature source Oral, resp. rate 18, height 5' 10.5" (1.791 m), weight 63.504 kg (140 lb), SpO2 96.00%. Extremities: edema improved, erythema improved  Disposition: d/c home  Discharge Orders    Future Orders Please Complete By Expires   Discharge wound care:      Scheduling Instructions:   Cleanse wound with dilute peroxide BID, cover with antibiotic ointment, zeroform and gauze BID     Current Discharge Medication List    START taking these medications   Details  doxycycline (VIBRA-TABS) 100 MG tablet Take 1 tablet (100 mg total) by mouth 2 (two) times daily. Qty: 14 tablet, Refills: 0      CONTINUE these medications which have NOT CHANGED   Details  albuterol (PROVENTIL HFA;VENTOLIN HFA) 108 (90 BASE) MCG/ACT inhaler Inhale 2 puffs into the lungs every 6 (six) hours as needed. wheezing     amLODipine (NORVASC) 10 MG tablet Take 10 mg by mouth daily.      buPROPion (WELLBUTRIN SR) 150 MG 12 hr tablet Take 150 mg by mouth 2 (two) times daily.      diphenhydrAMINE (BENADRYL) 25 MG tablet Take 25 mg by mouth every 4 (four) hours. For her IC    eletriptan  (RELPAX) 40 MG tablet One tablet by mouth as needed for migraine headache.  If the headache improves and then returns, dose may be repeated after 2 hours have elapsed since first dose (do not exceed 80 mg per day). may repeat in 2 hours if necessary. For migraines     estradiol (ESTRACE) 2 MG tablet Take 1 mg by mouth daily.      FLUoxetine (PROZAC) 40 MG capsule Take 40 mg by mouth daily.      montelukast (SINGULAIR) 10 MG tablet Take 10 mg by mouth at bedtime.      omeprazole (PRILOSEC) 20 MG capsule Take 20 mg by mouth daily.         Follow-up Information    Follow up with Tericka Devincenzi CHRISTOPHER. (If symptoms worsen call)    Contact information:   [address], [address] [place] [address] [phone]          Signed: Molinda Bailiff 04/25/2011, 1:10 PM

## 2011-04-26 LAB — WOUND CULTURE

## 2011-04-27 LAB — CULTURE, ROUTINE-ABSCESS

## 2013-04-08 ENCOUNTER — Emergency Department (HOSPITAL_COMMUNITY): Payer: BC Managed Care – PPO

## 2013-04-08 ENCOUNTER — Encounter (HOSPITAL_COMMUNITY): Payer: Self-pay | Admitting: Emergency Medicine

## 2013-04-08 ENCOUNTER — Emergency Department (HOSPITAL_COMMUNITY)
Admission: EM | Admit: 2013-04-08 | Discharge: 2013-04-08 | Disposition: A | Discharge status: Home / Self Care | Payer: BC Managed Care – PPO | Attending: Emergency Medicine | Admitting: Emergency Medicine

## 2013-04-08 ENCOUNTER — Observation Stay (HOSPITAL_COMMUNITY)
Admission: EM | Admit: 2013-04-08 | Discharge: 2013-04-10 | Disposition: A | Discharge status: Home / Self Care | Payer: BC Managed Care – PPO | Attending: Internal Medicine | Admitting: Internal Medicine

## 2013-04-08 DIAGNOSIS — Z862 Personal history of diseases of the blood and blood-forming organs and certain disorders involving the immune mechanism: Secondary | ICD-10-CM | POA: Insufficient documentation

## 2013-04-08 DIAGNOSIS — N83209 Unspecified ovarian cyst, unspecified side: Secondary | ICD-10-CM

## 2013-04-08 DIAGNOSIS — R1110 Vomiting, unspecified: Secondary | ICD-10-CM | POA: Insufficient documentation

## 2013-04-08 DIAGNOSIS — Z8619 Personal history of other infectious and parasitic diseases: Secondary | ICD-10-CM | POA: Insufficient documentation

## 2013-04-08 DIAGNOSIS — R059 Cough, unspecified: Secondary | ICD-10-CM | POA: Insufficient documentation

## 2013-04-08 DIAGNOSIS — I10 Essential (primary) hypertension: Secondary | ICD-10-CM | POA: Insufficient documentation

## 2013-04-08 DIAGNOSIS — Z8639 Personal history of other endocrine, nutritional and metabolic disease: Secondary | ICD-10-CM | POA: Insufficient documentation

## 2013-04-08 DIAGNOSIS — Z79899 Other long term (current) drug therapy: Secondary | ICD-10-CM | POA: Insufficient documentation

## 2013-04-08 DIAGNOSIS — R112 Nausea with vomiting, unspecified: Secondary | ICD-10-CM

## 2013-04-08 DIAGNOSIS — R05 Cough: Secondary | ICD-10-CM | POA: Insufficient documentation

## 2013-04-08 DIAGNOSIS — R51 Headache: Principal | ICD-10-CM | POA: Insufficient documentation

## 2013-04-08 DIAGNOSIS — K5900 Constipation, unspecified: Secondary | ICD-10-CM

## 2013-04-08 DIAGNOSIS — R1032 Left lower quadrant pain: Secondary | ICD-10-CM | POA: Insufficient documentation

## 2013-04-08 DIAGNOSIS — R5381 Other malaise: Secondary | ICD-10-CM | POA: Insufficient documentation

## 2013-04-08 DIAGNOSIS — Z90710 Acquired absence of both cervix and uterus: Secondary | ICD-10-CM | POA: Insufficient documentation

## 2013-04-08 DIAGNOSIS — Z792 Long term (current) use of antibiotics: Secondary | ICD-10-CM | POA: Insufficient documentation

## 2013-04-08 DIAGNOSIS — R630 Anorexia: Secondary | ICD-10-CM | POA: Insufficient documentation

## 2013-04-08 DIAGNOSIS — G43909 Migraine, unspecified, not intractable, without status migrainosus: Secondary | ICD-10-CM

## 2013-04-08 DIAGNOSIS — N390 Urinary tract infection, site not specified: Secondary | ICD-10-CM | POA: Insufficient documentation

## 2013-04-08 DIAGNOSIS — A491 Streptococcal infection, unspecified site: Secondary | ICD-10-CM | POA: Insufficient documentation

## 2013-04-08 DIAGNOSIS — E440 Moderate protein-calorie malnutrition: Secondary | ICD-10-CM | POA: Insufficient documentation

## 2013-04-08 DIAGNOSIS — J45909 Unspecified asthma, uncomplicated: Secondary | ICD-10-CM | POA: Insufficient documentation

## 2013-04-08 DIAGNOSIS — R519 Headache, unspecified: Secondary | ICD-10-CM

## 2013-04-08 LAB — CBC WITH DIFFERENTIAL/PLATELET
Basophils Absolute: 0 10*3/uL (ref 0.0–0.1)
Basophils Relative: 0 % (ref 0–1)
Eosinophils Absolute: 0 K/uL (ref 0.0–0.7)
Eosinophils Relative: 0 % (ref 0–5)
HCT: 40.6 % (ref 36.0–46.0)
Hemoglobin: 13.5 g/dL (ref 12.0–15.0)
Lymphocytes Relative: 7 % — ABNORMAL LOW (ref 12–46)
Lymphs Abs: 0.7 K/uL (ref 0.7–4.0)
MCH: 28.8 pg (ref 26.0–34.0)
MCHC: 33.3 g/dL (ref 30.0–36.0)
MCV: 86.6 fL (ref 78.0–100.0)
Monocytes Absolute: 0.4 10*3/uL (ref 0.1–1.0)
Monocytes Relative: 3 % (ref 3–12)
Neutro Abs: 9.7 10*3/uL — ABNORMAL HIGH (ref 1.7–7.7)
Neutrophils Relative %: 90 % — ABNORMAL HIGH (ref 43–77)
Platelets: 201 10*3/uL (ref 150–400)
RBC: 4.69 MIL/uL (ref 3.87–5.11)
RDW: 14.6 % (ref 11.5–15.5)
WBC: 10.8 10*3/uL — ABNORMAL HIGH (ref 4.0–10.5)

## 2013-04-08 LAB — URINALYSIS, ROUTINE W REFLEX MICROSCOPIC
Glucose, UA: NEGATIVE mg/dL
Hgb urine dipstick: NEGATIVE
Ketones, ur: 15 mg/dL — AB
Leukocytes, UA: NEGATIVE
Nitrite: NEGATIVE
Protein, ur: NEGATIVE mg/dL
Specific Gravity, Urine: 1.028 (ref 1.005–1.030)
Urobilinogen, UA: 1 mg/dL (ref 0.0–1.0)
pH: 7 (ref 5.0–8.0)

## 2013-04-08 LAB — COMPREHENSIVE METABOLIC PANEL
ALT: 12 U/L (ref 0–35)
AST: 18 U/L (ref 0–37)
Albumin: 4 g/dL (ref 3.5–5.2)
Alkaline Phosphatase: 86 U/L (ref 39–117)
CO2: 26 mEq/L (ref 19–32)
Calcium: 9.5 mg/dL (ref 8.4–10.5)
Chloride: 101 mEq/L (ref 96–112)
Creatinine, Ser: 0.89 mg/dL (ref 0.50–1.10)
GFR calc non Af Amer: 73 mL/min — ABNORMAL LOW (ref >90)
Glucose, Bld: 95 mg/dL (ref 70–99)
Sodium: 138 mEq/L (ref 135–145)
Total Bilirubin: 0.3 mg/dL (ref 0.3–1.2)

## 2013-04-08 LAB — COMPREHENSIVE METABOLIC PANEL WITH GFR
BUN: 10 mg/dL (ref 6–23)
GFR calc Af Amer: 84 mL/min — ABNORMAL LOW (ref >90)
Potassium: 3.6 meq/L (ref 3.5–5.1)
Total Protein: 7.1 g/dL (ref 6.0–8.3)

## 2013-04-08 LAB — TROPONIN I: Troponin I: <0.3 ng/mL (ref <0.30)

## 2013-04-08 LAB — LIPASE, BLOOD: Lipase: 15 U/L (ref 11–59)

## 2013-04-08 MED ORDER — ACETAMINOPHEN 650 MG RE SUPP: 650.0000 mg | Freq: Four times a day (QID) | RECTAL | Status: DC | PRN

## 2013-04-08 MED ORDER — DEXTROSE-NACL 5-0.9 % IV SOLN
INTRAVENOUS | Status: DC
  Administered 2013-04-09 – 2013-04-10 (×3): via INTRAVENOUS

## 2013-04-08 MED ORDER — BACLOFEN 10 MG PO TABS
10.0000 mg | ORAL_TABLET | Freq: Three times a day (TID) | ORAL | Status: DC | PRN
  Filled 2013-04-08: qty 1

## 2013-04-08 MED ORDER — METOCLOPRAMIDE HCL 5 MG/ML IJ SOLN
10.0000 mg | Freq: Once | INTRAMUSCULAR | Status: AC
  Administered 2013-04-08: 10 mg via INTRAVENOUS
  Filled 2013-04-08: qty 2

## 2013-04-08 MED ORDER — IOHEXOL 300 MG/ML  SOLN
50.0000 mL | Freq: Once | INTRAMUSCULAR | Status: AC | PRN
  Administered 2013-04-08: 50 mL via ORAL

## 2013-04-08 MED ORDER — IOHEXOL 300 MG/ML  SOLN
100.0000 mL | Freq: Once | INTRAMUSCULAR | Status: AC | PRN
  Administered 2013-04-08: 100 mL via INTRAVENOUS

## 2013-04-08 MED ORDER — MOMETASONE FURO-FORMOTEROL FUM 100-5 MCG/ACT IN AERO
2.0000 | INHALATION_SPRAY | Freq: Two times a day (BID) | RESPIRATORY_TRACT | Status: DC
  Administered 2013-04-09 – 2013-04-10 (×3): 2 via RESPIRATORY_TRACT
  Filled 2013-04-08: qty 8.8

## 2013-04-08 MED ORDER — KETOROLAC TROMETHAMINE 30 MG/ML IJ SOLN
30.0000 mg | Freq: Once | INTRAMUSCULAR | Status: AC
  Administered 2013-04-08: 30 mg via INTRAVENOUS
  Filled 2013-04-08: qty 1

## 2013-04-08 MED ORDER — ONDANSETRON 8 MG PO TBDP: 8.0000 mg | ORAL_TABLET | Freq: Two times a day (BID) | ORAL | Status: DC | PRN

## 2013-04-08 MED ORDER — SENNOSIDES-DOCUSATE SODIUM 8.6-50 MG PO TABS
1.0000 | ORAL_TABLET | Freq: Every day | ORAL | Status: DC
  Administered 2013-04-09 (×2): 1 via ORAL
  Filled 2013-04-08 (×4): qty 1

## 2013-04-08 MED ORDER — ONDANSETRON HCL 4 MG/2ML IJ SOLN: 4.0000 mg | Freq: Four times a day (QID) | INTRAMUSCULAR | Status: DC | PRN

## 2013-04-08 MED ORDER — ALBUTEROL SULFATE HFA 108 (90 BASE) MCG/ACT IN AERS
2.0000 | INHALATION_SPRAY | Freq: Four times a day (QID) | RESPIRATORY_TRACT | Status: DC | PRN
  Filled 2013-04-08: qty 6.7

## 2013-04-08 MED ORDER — OXYBUTYNIN CHLORIDE ER 15 MG PO TB24
15.0000 mg | ORAL_TABLET | Freq: Every day | ORAL | Status: DC
  Administered 2013-04-09 – 2013-04-10 (×2): 15 mg via ORAL
  Filled 2013-04-08 (×2): qty 1

## 2013-04-08 MED ORDER — FLUOXETINE HCL 20 MG PO CAPS
40.0000 mg | ORAL_CAPSULE | Freq: Every day | ORAL | Status: DC
  Administered 2013-04-09 – 2013-04-10 (×2): 40 mg via ORAL
  Filled 2013-04-08 (×2): qty 2

## 2013-04-08 MED ORDER — POLYETHYLENE GLYCOL 3350 17 G PO PACK
17.0000 g | PACK | Freq: Every day | ORAL | Status: DC
  Administered 2013-04-09 – 2013-04-10 (×2): 17 g via ORAL
  Filled 2013-04-08 (×2): qty 1

## 2013-04-08 MED ORDER — DEXTROSE 5 % AND 0.9 % NACL IV BOLUS
1000.0000 mL | Freq: Once | INTRAVENOUS | Status: AC
  Administered 2013-04-08: 1000 mL via INTRAVENOUS

## 2013-04-08 MED ORDER — ONDANSETRON HCL 4 MG PO TABS: 4.0000 mg | ORAL_TABLET | Freq: Four times a day (QID) | ORAL | Status: DC | PRN

## 2013-04-08 MED ORDER — KETOROLAC TROMETHAMINE 15 MG/ML IJ SOLN
15.0000 mg | Freq: Once | INTRAMUSCULAR | Status: AC
  Administered 2013-04-08: 15 mg via INTRAVENOUS
  Filled 2013-04-08: qty 1

## 2013-04-08 MED ORDER — BUPROPION HCL ER (XL) 300 MG PO TB24
300.0000 mg | ORAL_TABLET | Freq: Every day | ORAL | Status: DC
  Administered 2013-04-09 – 2013-04-10 (×2): 300 mg via ORAL
  Filled 2013-04-08 (×2): qty 1

## 2013-04-08 MED ORDER — ACETAMINOPHEN 325 MG PO TABS
650.0000 mg | ORAL_TABLET | Freq: Four times a day (QID) | ORAL | Status: DC | PRN
  Administered 2013-04-09 (×2): 650 mg via ORAL
  Filled 2013-04-08 (×2): qty 2

## 2013-04-08 MED ORDER — AMLODIPINE BESYLATE 10 MG PO TABS
10.0000 mg | ORAL_TABLET | Freq: Every day | ORAL | Status: DC
  Administered 2013-04-09 – 2013-04-10 (×2): 10 mg via ORAL
  Filled 2013-04-08 (×2): qty 1

## 2013-04-08 MED ORDER — PANTOPRAZOLE SODIUM 40 MG PO TBEC
40.0000 mg | DELAYED_RELEASE_TABLET | Freq: Every day | ORAL | Status: DC
  Administered 2013-04-09 – 2013-04-10 (×2): 40 mg via ORAL
  Filled 2013-04-08 (×2): qty 1

## 2013-04-08 MED ORDER — SODIUM CHLORIDE 0.9 % IV BOLUS (SEPSIS)
1000.0000 mL | Freq: Once | INTRAVENOUS | Status: AC
  Administered 2013-04-08: 1000 mL via INTRAVENOUS

## 2013-04-08 MED ORDER — POLYETHYLENE GLYCOL 3350 17 G PO PACK: 17.0000 g | PACK | Freq: Every day | ORAL | Status: DC

## 2013-04-08 MED ORDER — MONTELUKAST SODIUM 10 MG PO TABS
10.0000 mg | ORAL_TABLET | Freq: Every evening | ORAL | Status: DC | PRN
  Filled 2013-04-08: qty 1

## 2013-04-08 MED ORDER — DIPHENHYDRAMINE HCL 50 MG/ML IJ SOLN
25.0000 mg | Freq: Once | INTRAMUSCULAR | Status: AC
  Administered 2013-04-08: 25 mg via INTRAVENOUS
  Filled 2013-04-08: qty 1

## 2013-04-08 MED ORDER — MAGNESIUM SULFATE 40 MG/ML IJ SOLN
2.0000 g | Freq: Once | INTRAMUSCULAR | Status: AC
  Administered 2013-04-08: 2 g via INTRAVENOUS
  Filled 2013-04-08: qty 50

## 2013-04-08 MED ORDER — DIPHENHYDRAMINE HCL 25 MG PO CAPS
25.0000 mg | ORAL_CAPSULE | ORAL | Status: DC
  Administered 2013-04-09 – 2013-04-10 (×6): 25 mg via ORAL
  Filled 2013-04-08 (×16): qty 1

## 2013-04-08 MED ORDER — ESTRADIOL 1 MG PO TABS
1.0000 mg | ORAL_TABLET | Freq: Every day | ORAL | Status: DC
  Administered 2013-04-09 – 2013-04-10 (×2): 1 mg via ORAL
  Filled 2013-04-08 (×2): qty 1

## 2013-04-08 NOTE — ED Provider Notes (Signed)
CSN: [id number]     Arrival date & time 04/08/13  1110 History   First MD Initiated Contact with Patient 04/08/13 1207     Chief Complaint  Patient presents with  . n/v uti   (Consider location/radiation/quality/duration/timing/severity/associated sxs/prior Treatment) HPI Comments: Pt reports had recurring HA's along with intermittnet vomiting beginning 3 weeks ago, thought it was her usual migraines.  Happened again a week later, didn't feel "right" so went to see PCP.  Pt also developed chest congestion, coughing.  Was checked for UTI as she has history as well as h/o IC and found strep in urine and has been on keflex for about 1 week with no imprvoement.  Continues to have severe pressure HA, diffuse, no fever, rash, stiff neck.  Also intermittent vague abd pain mostly LLQ and into both flanks, slight increase in urinary frequency.  Continues to have intermittent vomiting, once this AM, last night, but not over the weekend.  Doesn't think this is related to keflex. No CP.  No pleurisy.  Coughing is improved.  Non smoker.  Usual OTC meds are not helping any of her symptoms.    Patient is a 52 y.o. female presenting with weakness. The history is provided by the patient.  Weakness This is a new problem. The current episode started more than 1 week ago. Associated symptoms include abdominal pain and headaches. Pertinent negatives include no chest pain and no shortness of breath.    Past Medical History  Diagnosis Date  . IBS (irritable bowel syndrome)   . Hypertension   . Ebsteins anomaly   . Migraine   . 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 Hypercholesteremia   . Asthma    Past Surgical History  Procedure Laterality Date  . Appendectomy    . Abdominal hysterectomy    . I&d extremity  04/23/2011    Procedure: IRRIGATION AND DEBRIDEMENT EXTREMITY;  Surgeon: Johnette Abraham;  Location: [facility] OR;  Service: Plastics;  Laterality: Left;   History reviewed. No pertinent  family history. History  Substance Use Topics  . Smoking status: Never Smoker   . Smokeless tobacco: Not on file  . Alcohol Use: 0.6 oz/week    1 Glasses of wine per week   OB History   Grav Para Term Preterm Abortions TAB SAB Ect Mult Living                 Review of Systems  Constitutional: Positive for appetite change. Negative for fever.  HENT: Negative for congestion.   Eyes: Negative for visual disturbance.  Respiratory: Positive for cough. Negative for shortness of breath.   Cardiovascular: Negative for chest pain.  Gastrointestinal: Positive for nausea, vomiting and abdominal pain. Negative for diarrhea, constipation and blood in stool.  Genitourinary: Positive for dysuria.  Musculoskeletal: Negative for back pain, myalgias, neck pain and neck stiffness.  Skin: Negative for rash.  Neurological: Positive for weakness and headaches. Negative for light-headedness and numbness.  All other systems reviewed and are negative.    Allergies  Sulfa antibiotics and Zithromax  Home Medications   Current Outpatient Rx  Name  Route  Sig  Dispense  Refill  . albuterol (PROVENTIL HFA;VENTOLIN HFA) 108 (90 BASE) MCG/ACT inhaler   Inhalation   Inhale 2 puffs into the lungs every 6 (six) hours as needed. wheezing          . amLODipine (NORVASC) 10 MG tablet   Oral   Take 10 mg by mouth daily.           Marland Kitchen  baclofen (LIORESAL) 10 MG tablet   Oral   Take 10 mg by mouth 3 (three) times daily as needed for muscle spasms (pain).         Marland Kitchen buPROPion (WELLBUTRIN SR) 150 MG 12 hr tablet   Oral   Take 150 mg by mouth 2 (two) times daily.           . cephALEXin (KEFLEX) 500 MG capsule   Oral   Take 500 mg by mouth 3 (three) times daily.         . diphenhydrAMINE (BENADRYL) 25 MG tablet   Oral   Take 25 mg by mouth every 4 (four) hours. For her IC         . estradiol (ESTRACE) 2 MG tablet   Oral   Take 1 mg by mouth daily.           Marland Kitchen FLUoxetine (PROZAC) 40 MG  capsule   Oral   Take 40 mg by mouth daily.           . Fluticasone-Salmeterol (ADVAIR) 250-50 MCG/DOSE AEPB   Inhalation   Inhale 1 puff into the lungs 2 (two) times daily.         . montelukast (SINGULAIR) 10 MG tablet   Oral   Take 10 mg by mouth at bedtime.           Marland Kitchen omeprazole (PRILOSEC) 20 MG capsule   Oral   Take 20 mg by mouth daily.           . ondansetron (ZOFRAN-ODT) 8 MG disintegrating tablet   Oral   Take 8 mg by mouth every 8 (eight) hours as needed for nausea or vomiting (Nausea).         Marland Kitchen oxybutynin (DITROPAN XL) 15 MG 24 hr tablet   Oral   Take 15 mg by mouth at bedtime.         . ondansetron (ZOFRAN-ODT) 8 MG disintegrating tablet   Oral   Take 1 tablet (8 mg total) by mouth every 12 (twelve) hours as needed for nausea.   20 tablet   0   . polyethylene glycol (MIRALAX / GLYCOLAX) packet   Oral   Take 17 g by mouth daily.   7 each   0    BP 151/81  Pulse 55  Temp(Src) 97.5 F (36.4 C) (Oral)  Resp 16  Ht 5\' 10"  (1.778 m)  Wt 135 lb (61.236 kg)  BMI 19.37 kg/m2  SpO2 100% Physical Exam  Nursing note and vitals reviewed. Constitutional: She is oriented to person, place, and time. She appears well-developed and well-nourished. No distress.  HENT:  Head: Normocephalic and atraumatic.  Eyes: Conjunctivae and EOM are normal. Pupils are equal, round, and reactive to light. No scleral icterus.  Neck: Normal range of motion. Neck supple.  Cardiovascular: Normal rate, regular rhythm and intact distal pulses.   Pulmonary/Chest: Effort normal. No respiratory distress. She has no wheezes.  Abdominal: Soft. She exhibits no distension. There is no tenderness. There is no rebound.  Neurological: She is alert and oriented to person, place, and time.  Skin: Skin is warm and dry. She is not diaphoretic.  Psychiatric: She has a normal mood and affect.    ED Course  Procedures (including critical care time) Labs Review Labs Reviewed  CBC  WITH DIFFERENTIAL - Abnormal; Notable for the following:    WBC 10.8 (*)    Neutrophils Relative % 90 (*)    Neutro Abs 9.7 (*)  Lymphocytes Relative 7 (*)    All other components within normal limits  COMPREHENSIVE METABOLIC PANEL - Abnormal; Notable for the following:    GFR calc non Af Amer 73 (*)    GFR calc Af Amer 84 (*)    All other components within normal limits  URINALYSIS, ROUTINE W REFLEX MICROSCOPIC - Abnormal; Notable for the following:    Bilirubin Urine SMALL (*)    Ketones, ur 15 (*)    All other components within normal limits  LIPASE, BLOOD  TROPONIN I   Imaging Review Ct Abdomen Pelvis W Contrast  04/08/2013   CLINICAL DATA:  Nausea, bilateral flank and abdominal pain.  EXAM: CT ABDOMEN AND PELVIS WITH CONTRAST  TECHNIQUE: Multidetector CT imaging of the abdomen and pelvis was performed using the standard protocol following bolus administration of intravenous contrast.  CONTRAST:  50mL OMNIPAQUE IOHEXOL 300 MG/ML SOLN, OMNIPAQUE IOHEXOL 300 MG/ML SOLN  COMPARISON:  Prior CT urogram 03/20/2009; prior renal ultrasound 03/23/2009  FINDINGS: Lower Chest: Mild dependent atelectasis. Cardiac structures within normal limits for size. No pericardial effusion. Unremarkable visualized distal thoracic esophagus.  Abdomen: Unremarkable CT appearance of the stomach, duodenum, spleen, adrenal glands and pancreas. Normal hepatic contour and morphology. Geographic hypoattenuation in the left hemi-liver adjacent to the fissure for the falciform ligament is nonspecific but most suggestive of benign focal fatty infiltration. No additional focal solid lesion identified. Gallbladder is unremarkable. No intra or extrahepatic biliary ductal dilatation.  Unremarkable appearance of the bilateral kidneys. No focal solid lesion or hydronephrosis. Nonobstructing 3 stone in the lower pole of the left kidney. Right upper and interpolar simple cysts.  No evidence of obstruction or focal bowel wall  thickening. Incidental enteroenteric intussusception in the left upper quadrant measures less than 3.5 cm. No definite obstructing mass identified. The terminal ileum is unremarkable. The appendix is not identified and is likely surgically absent. No free fluid or suspicious adenopathy.  Pelvis: Surgical changes of a bilateral hysterectomy. Indeterminate 3.3 x 2.5 cm intermediate attenuation cyst affiliated with the left ovary. Correlation with the prior CT scan from 2010 is limited due to the absence of IV contrast. There appear to be a left ovarian cyst at that time.  Bones/Soft Tissues: No acute fracture or aggressive appearing lytic or blastic osseous lesion. L5-S1 degenerative disc disease.  Vascular: No significant atherosclerotic vascular disease, aneurysmal dilatation or acute abnormality.  IMPRESSION: 1. No acute abnormality in the abdomen or pelvis to explain the patient's clinical symptoms. 2. Indeterminate 3.3 cm intermediate attenuation cystic structure affiliated with the left ovary. While this is almost certainly benign, the intermediate attenuation on CT scan warrants further nonemergent evaluation with pelvic ultrasound to exclude underlying ovarian neoplasm. 3. Nonobstructing left nephrolithiasis. 4. A 3.2 cm length enteroenteric intussusception in the left upper quadrant is likely an incidental finding.   Electronically Signed   By: Malachy Moan M.D.   On: 04/08/2013 15:27   Dg Abd Acute W/chest  04/08/2013   CLINICAL DATA:  Vomiting.  EXAM: ACUTE ABDOMEN SERIES (ABDOMEN 2 VIEW & CHEST 1 VIEW)  COMPARISON:  02/17/2011.  FINDINGS: Mediastinum and hilar structures are normal. Lungs are clear. Heart size normal. No bony abnormalities noted chest.  Abdominal soft tissues are unremarkable. Punctate calcification of the left kidney most consistent with left nephrolithiasis. No evidence of calcified stones in the distribution of the ureters. Large amount stool is noted throughout the colon  suggesting constipation. No free air or bowel distention noted. No acute bony abnormalities  noted in bony abdomen or pelvis.  IMPRESSION: 1. Negative chest. 2. Subcentimeter left nephrolithiasis. 3. Constipation.  No bowel distention.   Electronically Signed   By: Maisie Fus  Register   On: 04/08/2013 12:56    EKG Interpretation    Date/Time:  Tuesday April 08 2013 12:30:21 EST Ventricular Rate:  51 PR Interval:  147 QRS Duration: 99 QT Interval:  511 QTC Calculation: 471 R Axis:   -51 Text Interpretation:  Sinus rhythm Atrial premature complex LAD, consider left anterior fascicular block Confirmed by [facility]  MD, MICHEAL ([id number]) on 04/08/2013 12:38:38 PM           ra sat is 98% and I interpret to be normal  3:20 PM Pt feels improved, HA is resolved, nausea is improved.  Results discussed with pt and family.  3:35 PM CT shows incidental intussusception, ovarian cyst on left.  Will give Rx for nausea and for constipation, can follow up with her own PCP next week.    MDM   1. Migraine   2. Constipation   3. Ovarian cyst      Pt doesn't appear ill, HTN is only VS abn.  Non focal exam, no abd tenderness, lungs clear, HR is slightly bradycardic with great peripheral pulses.  Will treat HA with IV reglan, toradol for now, check labs, UA and obtain plain films.  No focal neuro deficits, no need for more advanced imaging at this point.  Afebrile.      Gavin Pound. Halford Goetzke, MD 04/08/13 1536

## 2013-04-08 NOTE — Discharge Instructions (Signed)
 Constipation, Adult Constipation is when a person has fewer than 3 bowel movements a week; has difficulty having a bowel movement; or has stools that are dry, hard, or larger than normal. As people grow older, constipation is more common. If you try to fix constipation with medicines that make you have a bowel movement (laxatives), the problem may get worse. Long-term laxative use may cause the muscles of the colon to become weak. A low-fiber diet, not taking in enough fluids, and taking certain medicines may make constipation worse. CAUSES   Certain medicines, such as antidepressants, pain medicine, iron supplements, antacids, and water pills.   Certain diseases, such as diabetes, irritable bowel syndrome (IBS), thyroid disease, or depression.   Not drinking enough water.   Not eating enough fiber-rich foods.   Stress or travel.  Lack of physical activity or exercise.  Not going to the restroom when there is the urge to have a bowel movement.  Ignoring the urge to have a bowel movement.  Using laxatives too much. SYMPTOMS   Having fewer than 3 bowel movements a week.   Straining to have a bowel movement.   Having hard, dry, or larger than normal stools.   Feeling full or bloated.   Pain in the lower abdomen.  Not feeling relief after having a bowel movement. DIAGNOSIS  Your caregiver will take a medical history and perform a physical exam. Further testing may be done for severe constipation. Some tests may include:   A barium enema X-ray to examine your rectum, colon, and sometimes, your small intestine.  A sigmoidoscopy to examine your lower colon.  A colonoscopy to examine your entire colon. TREATMENT  Treatment will depend on the severity of your constipation and what is causing it. Some dietary treatments include drinking more fluids and eating more fiber-rich foods. Lifestyle treatments may include regular exercise. If these diet and lifestyle recommendations  do not help, your caregiver may recommend taking over-the-counter laxative medicines to help you have bowel movements. Prescription medicines may be prescribed if over-the-counter medicines do not work.  HOME CARE INSTRUCTIONS   Increase dietary fiber in your diet, such as fruits, vegetables, whole grains, and beans. Limit high-fat and processed sugars in your diet, such as [place] fries, hamburgers, cookies, candies, and soda.   A fiber supplement may be added to your diet if you cannot get enough fiber from foods.   Drink enough fluids to keep your urine clear or pale yellow.   Exercise regularly or as directed by your caregiver.   Go to the restroom when you have the urge to go. Do not hold it.  Only take medicines as directed by your caregiver. Do not take other medicines for constipation without talking to your caregiver first. SEEK IMMEDIATE MEDICAL CARE IF:   You have bright red blood in your stool.   Your constipation lasts for more than 4 days or gets worse.   You have abdominal or rectal pain.   You have thin, pencil-like stools.  You have unexplained weight loss. MAKE SURE YOU:   Understand these instructions.  Will watch your condition.  Will get help right away if you are not doing well or get worse. Document Released: 02/04/2004 Document Revised: 07/31/2011 Document Reviewed: 04/11/2011 [facility] Patient Information 2014 [place], [place].     Migraine Headache A migraine headache is an intense, throbbing pain on one or both sides of your head. A migraine can last for 30 minutes to several hours. CAUSES  The exact  cause of a migraine headache is not always known. However, a migraine may be caused when nerves in the brain become irritated and release chemicals that cause inflammation. This causes pain. SYMPTOMS  Pain on one or both sides of your head.  Pulsating or throbbing pain.  Severe pain that prevents daily activities.  Pain that is aggravated  by any physical activity.  Nausea, vomiting, or both.  Dizziness.  Pain with exposure to bright lights, loud noises, or activity.  General sensitivity to bright lights, loud noises, or smells. Before you get a migraine, you may get warning signs that a migraine is coming (aura). An aura may include:  Seeing flashing lights.  Seeing bright spots, halos, or zig-zag lines.  Having tunnel vision or blurred vision.  Having feelings of numbness or tingling.  Having trouble talking.  Having muscle weakness. MIGRAINE TRIGGERS  Alcohol.  Smoking.  Stress.  Menstruation.  Aged cheeses.  Foods or drinks that contain nitrates, glutamate, aspartame, or tyramine.  Lack of sleep.  Chocolate.  Caffeine .  Hunger.  Physical exertion.  Fatigue.  Medicines used to treat chest pain (nitroglycerine), birth control pills, estrogen, and some blood pressure medicines. DIAGNOSIS  A migraine headache is often diagnosed based on:  Symptoms.  Physical examination.  A CT scan or MRI of your head. TREATMENT Medicines may be given for pain and nausea. Medicines can also be given to help prevent recurrent migraines.  HOME CARE INSTRUCTIONS  Only take over-the-counter or prescription medicines for pain or discomfort as directed by your caregiver. The use of long-term narcotics is not recommended.  Lie down in a dark, quiet room when you have a migraine.  Keep a journal to find out what may trigger your migraine headaches. For example, write down:  What you eat and drink.  How much sleep you get.  Any change to your diet or medicines.  Limit alcohol consumption.  Quit smoking if you smoke.  Get 7 to 9 hours of sleep, or as recommended by your caregiver.  Limit stress.  Keep lights dim if bright lights bother you and make your migraines worse. SEEK IMMEDIATE MEDICAL CARE IF:   Your migraine becomes severe.  You have a fever.  You have a stiff neck.  You have  vision loss.  You have muscular weakness or loss of muscle control.  You start losing your balance or have trouble walking.  You feel faint or pass out.  You have severe symptoms that are different from your first symptoms. MAKE SURE YOU:   Understand these instructions.  Will watch your condition.  Will get help right away if you are not doing well or get worse. Document Released: 05/08/2005 Document Revised: 07/31/2011 Document Reviewed: 04/28/2011 [facility] Patient Information 2014 [place], [place].     Ovarian Cyst The ovaries are small organs that are on each side of the uterus. The ovaries are the organs that produce the female hormones, estrogen and progesterone. An ovarian cyst is a sac filled with fluid that can vary in its size. It is normal for a small cyst to form in women who are in the childbearing age and who have menstrual periods. This type of cyst is called a follicle cyst that becomes an ovulation cyst (corpus luteum cyst) after it produces the women's egg. It later goes away on its own if the woman does not become pregnant. There are other kinds of ovarian cysts that may cause problems and may need to be treated. The most serious  problem is a cyst with cancer. It should be noted that menopausal women who have an ovarian cyst are at a higher risk of it being a cancer cyst. They should be evaluated very quickly, thoroughly and followed closely. This is especially true in menopausal women because of the high rate of ovarian cancer in women in menopause. CAUSES AND TYPES OF OVARIAN CYSTS:  FUNCTIONAL CYST: The follicle/corpus luteum cyst is a functional cyst that occurs every month during ovulation with the menstrual cycle. They go away with the next menstrual cycle if the woman does not get pregnant. Usually, there are no symptoms with a functional cyst.  ENDOMETRIOMA CYST: This cyst develops from the lining of the uterus tissue. This cyst gets in or on the ovary. It grows  every month from the bleeding during the menstrual period. It is also called a chocolate cyst because it becomes filled with blood that turns brown. This cyst can cause pain in the lower abdomen during intercourse and with your menstrual period.  CYSTADENOMA CYST: This cyst develops from the cells on the outside of the ovary. They usually are not cancerous. They can get very big and cause lower abdomen pain and pain with intercourse. This type of cyst can twist on itself, cut off its blood supply and cause severe pain. It also can easily rupture and cause a lot of pain.  DERMOID CYST: This type of cyst is sometimes found in both ovaries. They are found to have different kinds of body tissue in the cyst. The tissue includes skin, teeth, hair, and/or cartilage. They usually do not have symptoms unless they get very big. Dermoid cysts are rarely cancerous.  POLYCYSTIC OVARY: This is a rare condition with hormone problems that produces many small cysts on both ovaries. The cysts are follicle-like cysts that never produce an egg and become a corpus luteum. It can cause an increase in body weight, infertility, acne, increase in body and facial hair and lack of menstrual periods or rare menstrual periods. Many women with this problem develop type 2 diabetes. The exact cause of this problem is unknown. A polycystic ovary is rarely cancerous.  THECA LUTEIN CYST: Occurs when too much hormone (human chorionic gonadotropin) is produced and over-stimulates the ovaries to produce an egg. They are frequently seen when doctors stimulate the ovaries for invitro-fertilization (test tube babies).  LUTEOMA CYST: This cyst is seen during pregnancy. Rarely it can cause an obstruction to the birth canal during labor and delivery. They usually go away after delivery. SYMPTOMS   Pelvic pain or pressure.  Pain during sexual intercourse.  Increasing girth (swelling) of the abdomen.  Abnormal menstrual  periods.  Increasing pain with menstrual periods.  You stop having menstrual periods and you are not pregnant. DIAGNOSIS  The diagnosis can be made during:  Routine or annual pelvic examination (common).  Ultrasound.  X-ray of the pelvis.  CT Scan.  MRI.  Blood tests. TREATMENT   Treatment may only be to follow the cyst monthly for 2 to 3 months with your caregiver. Many go away on their own, especially functional cysts.  May be aspirated (drained) with a long needle with ultrasound, or by laparoscopy (inserting a tube into the pelvis through a small incision).  The whole cyst can be removed by laparoscopy.  Sometimes the cyst may need to be removed through an incision in the lower abdomen.  Hormone treatment is sometimes used to help dissolve certain cysts.  Birth control pills are sometimes used  to help dissolve certain cysts. HOME CARE INSTRUCTIONS  Follow your caregiver's advice regarding:  Medicine.  Follow up visits to evaluate and treat the cyst.  You may need to come back or make an appointment with another caregiver, to find the exact cause of your cyst, if your caregiver is not a gynecologist.  Get your yearly and recommended pelvic examinations and Pap tests.  Let your caregiver know if you have had an ovarian cyst in the past. SEEK MEDICAL CARE IF:   Your periods are late, irregular, they stop, or are painful.  Your stomach (abdomen) or pelvic pain does not go away.  Your stomach becomes larger or swollen.  You have pressure on your bladder or trouble emptying your bladder completely.  You have painful sexual intercourse.  You have feelings of fullness, pressure, or discomfort in your stomach.  You lose weight for no apparent reason.  You feel generally ill.  You become constipated.  You lose your appetite.  You develop acne.  You have an increase in body and facial hair.  You are gaining weight, without changing your exercise and  eating habits.  You think you are pregnant. SEEK IMMEDIATE MEDICAL CARE IF:   You have increasing abdominal pain.  You feel sick to your stomach (nausea) and/or vomit.  You develop a fever that comes on suddenly.  You develop abdominal pain during a bowel movement.  Your menstrual periods become heavier than usual. Document Released: 05/08/2005 Document Revised: 07/31/2011 Document Reviewed: 03/11/2009 [facility] Patient Information 2014 [place], [place].

## 2013-04-08 NOTE — ED Provider Notes (Signed)
CSN: [id number]     Arrival date & time 04/08/13  1916 History   First MD Initiated Contact with Patient 04/08/13 2037     Chief Complaint  Patient presents with  . Emesis   (Consider location/radiation/quality/duration/timing/severity/associated sxs/prior Treatment) HPI  52 y.o. female who presents with recurring HA's along with intermittnen vomiting beginning 3 weeks ago. Happened again a week later, didn't feel "right" so went to see PCP. Pt also developed chest congestion, coughing. Was checked for UTI as she has history as well as h/o IC and found strep in urine and has been on keflex for about 1 week with no improvement. Continues to have severe pressure HA, diffuse, no fever, rash, stiff neck. She also has intermittent vague abd pain mostly LLQ With a slight increase in urinary frequency- although hard to tell with IC. Continues to have intermittent vomiting, once this AM, last night, but not over the weekend. Doesn't think this is related to keflex. No CP. No pleurisy. Coughing is improved. Non smoker. Usual OTC meds are not helping any of her symptoms. Seen in ED earlier today for same and was feeling better but symptoms worsening agin shortly after going home so came back.    Past Medical History  Diagnosis Date  . IBS (irritable bowel syndrome)   . Hypertension   . Ebsteins anomaly   . Migraine   . 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 Hypercholesteremia   . Asthma    Past Surgical History  Procedure Laterality Date  . Appendectomy    . Abdominal hysterectomy    . I&d extremity  04/23/2011    Procedure: IRRIGATION AND DEBRIDEMENT EXTREMITY;  Surgeon: Johnette Abraham;  Location: [facility] OR;  Service: Plastics;  Laterality: Left;   No family history on file. History  Substance Use Topics  . Smoking status: Never Smoker   . Smokeless tobacco: Not on file  . Alcohol Use: 0.6 oz/week    1 Glasses of wine per week   OB History   Grav Para Term Preterm  Abortions TAB SAB Ect Mult Living                 Review of Systems  All systems reviewed and negative, other than as noted in HPI.   Allergies  Sulfa antibiotics and Zithromax  Home Medications   Current Outpatient Rx  Name  Route  Sig  Dispense  Refill  . albuterol (PROVENTIL HFA;VENTOLIN HFA) 108 (90 BASE) MCG/ACT inhaler   Inhalation   Inhale 2 puffs into the lungs every 6 (six) hours as needed. wheezing          . amLODipine (NORVASC) 10 MG tablet   Oral   Take 10 mg by mouth daily.           [place] Kitchen amoxicillin (AMOXIL) 500 MG capsule   Oral   Take 500 mg by mouth 3 (three) times daily.          . baclofen (LIORESAL) 10 MG tablet   Oral   Take 10 mg by mouth 3 (three) times daily as needed for muscle spasms (pain).         [place] Kitchen buPROPion (WELLBUTRIN SR) 150 MG 12 hr tablet   Oral   Take 150 mg by mouth 2 (two) times daily.           . cephALEXin (KEFLEX) 500 MG capsule   Oral   Take 500 mg by mouth 3 (three) times daily.         [place] Kitchen  diphenhydrAMINE (BENADRYL) 25 MG tablet   Oral   Take 25 mg by mouth every 4 (four) hours. For her IC         . estradiol (ESTRACE) 2 MG tablet   Oral   Take 1 mg by mouth daily.           [place] Kitchen FLUoxetine (PROZAC) 40 MG capsule   Oral   Take 40 mg by mouth daily.           . Fluticasone-Salmeterol (ADVAIR) 250-50 MCG/DOSE AEPB   Inhalation   Inhale 1 puff into the lungs 2 (two) times daily.         . montelukast (SINGULAIR) 10 MG tablet   Oral   Take 10 mg by mouth at bedtime as needed (seasonal allergies).          [place] Kitchen omeprazole (PRILOSEC) 20 MG capsule   Oral   Take 20 mg by mouth as needed (acid reflux).          . ondansetron (ZOFRAN-ODT) 8 MG disintegrating tablet   Oral   Take 1 tablet (8 mg total) by mouth every 12 (twelve) hours as needed for nausea.   20 tablet   0   . oxybutynin (DITROPAN XL) 15 MG 24 hr tablet   Oral   Take 15 mg by mouth at bedtime.         . polyethylene glycol  (MIRALAX / GLYCOLAX) packet   Oral   Take 17 g by mouth daily.   7 each   0    BP 156/98  Pulse 53  Temp(Src) 97.5 F (36.4 C) (Oral)  Resp 16  SpO2 99% Physical Exam  Nursing note and vitals reviewed. Constitutional: She is oriented to person, place, and time. She appears well-developed and well-nourished.  Sitting up in bed. Tired appearing, but not toxic.   HENT:  Head: Normocephalic and atraumatic.  Eyes: Conjunctivae are normal. Right eye exhibits no discharge. Left eye exhibits no discharge.  Neck: Normal range of motion. Neck supple.  No nuchal rigidity  Cardiovascular: Normal rate, regular rhythm and normal heart sounds.  Exam reveals no gallop and no friction rub.   No murmur heard. Pulmonary/Chest: Effort normal and breath sounds normal. No respiratory distress.  Abdominal: Soft. She exhibits no distension. There is tenderness.  Mild left-sided abdominal tenderness without rebound or guarding. No distention.  Genitourinary:  No CVA tenderness  Musculoskeletal: She exhibits no edema and no tenderness.  Neurological: She is alert and oriented to person, place, and time. No cranial nerve deficit. She exhibits normal muscle tone. Coordination normal.  Skin: Skin is warm and dry. She is not diaphoretic.  Psychiatric: Her behavior is normal. Thought content normal.    ED Course  Procedures (including critical care time) Labs Review Labs Reviewed - No data to display Imaging Review Ct Abdomen Pelvis W Contrast  04/08/2013   CLINICAL DATA:  Nausea, bilateral flank and abdominal pain.  EXAM: CT ABDOMEN AND PELVIS WITH CONTRAST  TECHNIQUE: Multidetector CT imaging of the abdomen and pelvis was performed using the standard protocol following bolus administration of intravenous contrast.  CONTRAST:  50mL OMNIPAQUE IOHEXOL 300 MG/ML SOLN, OMNIPAQUE IOHEXOL 300 MG/ML SOLN  COMPARISON:  Prior CT urogram 03/20/2009; prior renal ultrasound 03/23/2009  FINDINGS: Lower Chest:  Mild dependent atelectasis. Cardiac structures within normal limits for size. No pericardial effusion. Unremarkable visualized distal thoracic esophagus.  Abdomen: Unremarkable CT appearance of the stomach, duodenum, spleen, adrenal glands and pancreas. Normal  hepatic contour and morphology. Geographic hypoattenuation in the left hemi-liver adjacent to the fissure for the falciform ligament is nonspecific but most suggestive of benign focal fatty infiltration. No additional focal solid lesion identified. Gallbladder is unremarkable. No intra or extrahepatic biliary ductal dilatation.  Unremarkable appearance of the bilateral kidneys. No focal solid lesion or hydronephrosis. Nonobstructing 3 stone in the lower pole of the left kidney. Right upper and interpolar simple cysts.  No evidence of obstruction or focal bowel wall thickening. Incidental enteroenteric intussusception in the left upper quadrant measures less than 3.5 cm. No definite obstructing mass identified. The terminal ileum is unremarkable. The appendix is not identified and is likely surgically absent. No free fluid or suspicious adenopathy.  Pelvis: Surgical changes of a bilateral hysterectomy. Indeterminate 3.3 x 2.5 cm intermediate attenuation cyst affiliated with the left ovary. Correlation with the prior CT scan from 2010 is limited due to the absence of IV contrast. There appear to be a left ovarian cyst at that time.  Bones/Soft Tissues: No acute fracture or aggressive appearing lytic or blastic osseous lesion. L5-S1 degenerative disc disease.  Vascular: No significant atherosclerotic vascular disease, aneurysmal dilatation or acute abnormality.  IMPRESSION: 1. No acute abnormality in the abdomen or pelvis to explain the patient's clinical symptoms. 2. Indeterminate 3.3 cm intermediate attenuation cystic structure affiliated with the left ovary. While this is almost certainly benign, the intermediate attenuation on CT scan warrants further  nonemergent evaluation with pelvic ultrasound to exclude underlying ovarian neoplasm. 3. Nonobstructing left nephrolithiasis. 4. A 3.2 cm length enteroenteric intussusception in the left upper quadrant is likely an incidental finding.   Electronically Signed   By: Malachy Moan M.D.   On: 04/08/2013 15:27   Dg Abd Acute W/chest  04/08/2013   CLINICAL DATA:  Vomiting.  EXAM: ACUTE ABDOMEN SERIES (ABDOMEN 2 VIEW & CHEST 1 VIEW)  COMPARISON:  02/17/2011.  FINDINGS: Mediastinum and hilar structures are normal. Lungs are clear. Heart size normal. No bony abnormalities noted chest.  Abdominal soft tissues are unremarkable. Punctate calcification of the left kidney most consistent with left nephrolithiasis. No evidence of calcified stones in the distribution of the ureters. Large amount stool is noted throughout the colon suggesting constipation. No free air or bowel distention noted. No acute bony abnormalities noted in bony abdomen or pelvis.  IMPRESSION: 1. Negative chest. 2. Subcentimeter left nephrolithiasis. 3. Constipation.  No bowel distention.   Electronically Signed   By: Maisie Fus  Register   On: 04/08/2013 12:56    EKG Interpretation   None       MDM   1. Nausea and vomiting   2. Headache   3. Constipation   4. Nausea & vomiting   5. Malnutrition of moderate degree    52 year old female with multiple complaints. Very low suspicion for emergent etiology, but patient remains fairly asymptomatic. Was in the emergency room earlier today for similar complaints. This workup was reviewed. Additionally, a head CT was done on repeat visit which not show any acute abnormality. Neuro exam is nonfocal. Concern that patient will fail if discharged again. Will admit for symptom control.    Raeford Razor, MD 04/14/13 1710

## 2013-04-08 NOTE — ED Notes (Signed)
Treated earlier today for c/o nausea/vomiting; told to return if no better; started vomiting when got home; left lower quad abd pain

## 2013-04-08 NOTE — ED Notes (Signed)
Patient feels dehydrated with headache.  Cannot keep medications down.  Taking Keflex but it is not agreeing with her.  She was prescribed Amoxicillin but continued to have nausea and vomiting.

## 2013-04-08 NOTE — ED Notes (Signed)
Urine collected.

## 2013-04-08 NOTE — Progress Notes (Signed)
Patient confirms her pcp is Dr. Mila Palmer at [facility].  System updated.

## 2013-04-08 NOTE — ED Notes (Signed)
Pt coming to ed with c/o nausea, vomiting x 2 weeks, sts seen by pcp, was told has strep in urine, taking keflex since last Thursday. Reports flank pain bilaterally, and lower abdominal pain.

## 2013-04-08 NOTE — H&P (Addendum)
[facility] History and Physical  Christina Singleton ZOX:[id number] DOB: Oct 15, 1960 DOA: 04/08/2013  Referring physician: er PCP: Emeterio Reeve, MD  Specialists:   Chief Complaint: intermittent headache/n/v  HPI: Christina Singleton is a 52 y.o. female   recurring HA's along with intermittnet vomiting beginning 3 weeks ago.  She thought it was her usual migraines. Happened again a week later, didn't feel "right" so went to see PCP. Pt also developed chest congestion, coughing. Was checked for UTI as she has history as well as h/o IC and found strep in urine and has been on keflex for about 1 week with no imprvoement. Continues to have severe pressure HA, diffuse, no fever, rash, stiff neck. She also has intermittent vague abd pain mostly LLQ  With a slight increase in urinary frequency- although hard to tell with IC. Continues to have intermittent vomiting, once this AM, last night, but not over the weekend. Doesn't think this is related to keflex. No CP. No pleurisy. Coughing is improved. Non smoker. Usual OTC meds are not helping any of her symptoms.  Works with children No sore throat  Hospitalist were asked to obs for recurrent N/V and headache  Review of Systems: all systems reviewed, negative unless stated above   Past Medical History  Diagnosis Date  . IBS (irritable bowel syndrome)   . Hypertension   . Ebsteins anomaly   . Migraine   . 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 Hypercholesteremia   . Asthma    Past Surgical History  Procedure Laterality Date  . Appendectomy    . Abdominal hysterectomy    . I&d extremity  04/23/2011    Procedure: IRRIGATION AND DEBRIDEMENT EXTREMITY;  Surgeon: Johnette Abraham;  Location: [facility] OR;  Service: Plastics;  Laterality: Left;   Social History:  reports that she has never smoked. She does not have any smokeless tobacco history on file. She reports that she drinks about 0.6 ounces of alcohol per week. She reports  that she does not use illicit drugs.   Allergies  Allergen Reactions  . Sulfa Antibiotics Other (See Comments)    hallucinations  . Zithromax [Azithromycin] Other (See Comments)    Made her feel weird    Family hx- mother healthy  Prior to Admission medications   Medication Sig Start Date End Date Taking? Authorizing Provider  albuterol (PROVENTIL HFA;VENTOLIN HFA) 108 (90 BASE) MCG/ACT inhaler Inhale 2 puffs into the lungs every 6 (six) hours as needed. wheezing    Yes Historical Provider, MD  amLODipine (NORVASC) 10 MG tablet Take 10 mg by mouth daily.     Yes Historical Provider, MD  amoxicillin (AMOXIL) 500 MG capsule Take 500 mg by mouth 3 (three) times daily.  04/07/13  Yes Historical Provider, MD  baclofen (LIORESAL) 10 MG tablet Take 10 mg by mouth 3 (three) times daily as needed for muscle spasms (pain).   Yes Historical Provider, MD  buPROPion (WELLBUTRIN SR) 150 MG 12 hr tablet Take 150 mg by mouth 2 (two) times daily.     Yes Historical Provider, MD  cephALEXin (KEFLEX) 500 MG capsule Take 500 mg by mouth 3 (three) times daily.   Yes Historical Provider, MD  diphenhydrAMINE (BENADRYL) 25 MG tablet Take 25 mg by mouth every 4 (four) hours. For her IC   Yes Historical Provider, MD  estradiol (ESTRACE) 2 MG tablet Take 1 mg by mouth daily.     Yes Historical Provider, MD  FLUoxetine (PROZAC) 40 MG  capsule Take 40 mg by mouth daily.     Yes Historical Provider, MD  Fluticasone-Salmeterol (ADVAIR) 250-50 MCG/DOSE AEPB Inhale 1 puff into the lungs 2 (two) times daily.   Yes Historical Provider, MD  montelukast (SINGULAIR) 10 MG tablet Take 10 mg by mouth at bedtime as needed (seasonal allergies).    Yes Historical Provider, MD  omeprazole (PRILOSEC) 20 MG capsule Take 20 mg by mouth as needed (acid reflux).    Yes Historical Provider, MD  ondansetron (ZOFRAN-ODT) 8 MG disintegrating tablet Take 1 tablet (8 mg total) by mouth every 12 (twelve) hours as needed for nausea. 04/08/13  Yes  Gavin Pound. Ghim, MD  oxybutynin (DITROPAN XL) 15 MG 24 hr tablet Take 15 mg by mouth at bedtime.   Yes Historical Provider, MD  polyethylene glycol (MIRALAX / GLYCOLAX) packet Take 17 g by mouth daily. 04/08/13  Yes Gavin Pound. Ghim, MD   Physical Exam: Filed Vitals:   04/08/13 1924  BP: 156/98  Pulse: 53  Temp: 97.5 F (36.4 C)  Resp: 16    Constitutional: She is oriented to person, place, and time. She appears well-developed and well-nourished.  not toxic.  HENT:  Head: Normocephalic and atraumatic.  Eyes: Conjunctivae are normal. Right eye exhibits no discharge. Left eye exhibits no discharge.  Neck: Normal range of motion. Neck supple.  No nuchal rigidity  Cardiovascular: Normal rate, regular rhythm and normal heart sounds. Exam reveals no gallop and no friction rub.  No murmur heard.  Pulmonary/Chest: Effort normal and breath sounds normal. No respiratory distress.  Abdominal: Soft. She exhibits no distension. There is no tenderness.  Musculoskeletal: She exhibits no edema and no tenderness.  Neurological: She is alert and oriented to person, place, and time. No cranial nerve deficit. She exhibits normal muscle tone. Coordination normal.  Skin: Skin is warm and dry. She is not diaphoretic. No lymph nodes Psychiatric: Her behavior is normal. Thought content normal  Labs on Admission:  Basic Metabolic Panel:  Recent Labs Lab 04/08/13 1240  NA 138  K 3.6  CL 101  CO2 26  GLUCOSE 95  BUN 10  CREATININE 0.89  CALCIUM 9.5   Liver Function Tests:  Recent Labs Lab 04/08/13 1240  AST 18  ALT 12  ALKPHOS 86  BILITOT 0.3  PROT 7.1  ALBUMIN 4.0    Recent Labs Lab 04/08/13 1240  LIPASE 15   No results found for this basename: AMMONIA,  in the last 168 hours CBC:  Recent Labs Lab 04/08/13 1240  WBC 10.8*  NEUTROABS 9.7*  HGB 13.5  HCT 40.6  MCV 86.6  PLT 201   Cardiac Enzymes:  Recent Labs Lab 04/08/13 1240  TROPONINI <0.30    BNP (last 3  results) No results found for this basename: PROBNP,  in the last 8760 hours CBG: No results found for this basename: GLUCAP,  in the last 168 hours  Radiological Exams on Admission: Ct Head Wo Contrast  04/08/2013   CLINICAL DATA:  Headache, vomiting.  EXAM: CT HEAD WITHOUT CONTRAST  TECHNIQUE: Contiguous axial images were obtained from the base of the skull through the vertex without intravenous contrast.  COMPARISON:  CT scan of February 17, 2011.  FINDINGS: Bony calvarium is intact. No mass effect or midline shift is noted. Ventricular size is within normal limits. There is no evidence of mass lesion, hemorrhage or acute infarction.  IMPRESSION: No gross intracranial abnormality seen.   Electronically Signed   By: Marry Guan.D.  On: 04/08/2013 21:27   Ct Abdomen Pelvis W Contrast  04/08/2013   CLINICAL DATA:  Nausea, bilateral flank and abdominal pain.  EXAM: CT ABDOMEN AND PELVIS WITH CONTRAST  TECHNIQUE: Multidetector CT imaging of the abdomen and pelvis was performed using the standard protocol following bolus administration of intravenous contrast.  CONTRAST:  50mL OMNIPAQUE IOHEXOL 300 MG/ML SOLN, OMNIPAQUE IOHEXOL 300 MG/ML SOLN  COMPARISON:  Prior CT urogram 03/20/2009; prior renal ultrasound 03/23/2009  FINDINGS: Lower Chest: Mild dependent atelectasis. Cardiac structures within normal limits for size. No pericardial effusion. Unremarkable visualized distal thoracic esophagus.  Abdomen: Unremarkable CT appearance of the stomach, duodenum, spleen, adrenal glands and pancreas. Normal hepatic contour and morphology. Geographic hypoattenuation in the left hemi-liver adjacent to the fissure for the falciform ligament is nonspecific but most suggestive of benign focal fatty infiltration. No additional focal solid lesion identified. Gallbladder is unremarkable. No intra or extrahepatic biliary ductal dilatation.  Unremarkable appearance of the bilateral kidneys. No focal solid lesion or  hydronephrosis. Nonobstructing 3 stone in the lower pole of the left kidney. Right upper and interpolar simple cysts.  No evidence of obstruction or focal bowel wall thickening. Incidental enteroenteric intussusception in the left upper quadrant measures less than 3.5 cm. No definite obstructing mass identified. The terminal ileum is unremarkable. The appendix is not identified and is likely surgically absent. No free fluid or suspicious adenopathy.  Pelvis: Surgical changes of a bilateral hysterectomy. Indeterminate 3.3 x 2.5 cm intermediate attenuation cyst affiliated with the left ovary. Correlation with the prior CT scan from 2010 is limited due to the absence of IV contrast. There appear to be a left ovarian cyst at that time.  Bones/Soft Tissues: No acute fracture or aggressive appearing lytic or blastic osseous lesion. L5-S1 degenerative disc disease.  Vascular: No significant atherosclerotic vascular disease, aneurysmal dilatation or acute abnormality.  IMPRESSION: 1. No acute abnormality in the abdomen or pelvis to explain the patient's clinical symptoms. 2.. 3. Nonobstructing left nephrolithiasis. 4. A 3.2 cm length enteroenteric intussusception in the left upper quadrant is likely an incidental finding.   Electronically Signed   By: Malachy Moan M.D.   On: 04/08/2013 15:27   Dg Abd Acute W/chest  04/08/2013   CLINICAL DATA:  Vomiting.  EXAM: ACUTE ABDOMEN SERIES (ABDOMEN 2 VIEW & CHEST 1 VIEW)  COMPARISON:  02/17/2011.  FINDINGS: Mediastinum and hilar structures are normal. Lungs are clear. Heart size normal. No bony abnormalities noted chest.  Abdominal soft tissues are unremarkable. Punctate calcification of the left kidney most consistent with left nephrolithiasis. No evidence of calcified stones in the distribution of the ureters. Large amount stool is noted throughout the colon suggesting constipation. No free air or bowel distention noted. No acute bony abnormalities noted in bony abdomen  or pelvis.  IMPRESSION: 1. Negative chest. 2. Subcentimeter left nephrolithiasis. 3. Constipation.  No bowel distention.   Electronically Signed   By: Maisie Fus  Register   On: 04/08/2013 12:56      Assessment/Plan Active Problems:   Headache   Nausea & vomiting   Headache with intermittent N/V- ? 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 Migraine?  Constipation- colase and miralax  3.2 cm length enteroenteric intussusception in the left upper quadrant is likely an incidental finding   Indeterminate 3.3 cm intermediate attenuation cystic structure affiliated with the left ovary. While this is almost certainly benign,  the intermediate attenuation on CT scan warrants further nonemergent evaluation with pelvic ultrasound to exclude underlying ovarian neoplasm   Code Status: full Family Communication: patient/mother Disposition Plan: obs  Time spent: 75 min  Melayna Robarts [facility] Pager [phone]  If 7PM-7AM, please contact night-coverage [email or web address] Password [facility] 04/08/2013, 10:25 PM

## 2013-04-09 DIAGNOSIS — E440 Moderate protein-calorie malnutrition: Secondary | ICD-10-CM | POA: Insufficient documentation

## 2013-04-09 LAB — CBC
HCT: 36.7 % (ref 36.0–46.0)
MCV: 86.8 fL (ref 78.0–100.0)
Platelets: 200 10*3/uL (ref 150–400)
RBC: 4.23 MIL/uL (ref 3.87–5.11)
WBC: 6.8 10*3/uL (ref 4.0–10.5)

## 2013-04-09 LAB — BASIC METABOLIC PANEL
CO2: 27 mEq/L (ref 19–32)
Calcium: 8.4 mg/dL (ref 8.4–10.5)
Chloride: 107 mEq/L (ref 96–112)
Sodium: 139 mEq/L (ref 135–145)

## 2013-04-09 LAB — MONONUCLEOSIS SCREEN: Mono Screen: NEGATIVE

## 2013-04-09 MED ORDER — KETOROLAC TROMETHAMINE 15 MG/ML IJ SOLN
15.0000 mg | Freq: Three times a day (TID) | INTRAMUSCULAR | Status: DC | PRN
  Administered 2013-04-09 – 2013-04-10 (×2): 15 mg via INTRAVENOUS
  Filled 2013-04-09 (×2): qty 1

## 2013-04-09 NOTE — Progress Notes (Signed)
INITIAL NUTRITION ASSESSMENT  Pt meets criteria for moderate MALNUTRITION in the context of acute illness as evidenced by <75% estimated energy intake in the past 3 weeks in addition to pt with mild/moderate muscle wasting in upper arms.  DOCUMENTATION CODES Per approved criteria  -Non-severe (moderate) malnutrition in the context of acute illness or injury   INTERVENTION: - Diet advancement per MD - Anti-emetics per MD - Will continue to monitor   NUTRITION DIAGNOSIS: Inadequate oral intake related to poor appetite/afraid to eat r/t concern it will cause nausea/vomiting as evidenced by pt report.   Goal: 1. No further nausea/vomiting 2. Pt to consume >90% of meals  Monitor:  Weights, labs, intake, nausea/vomiting  Reason for Assessment: Nutrition risk   52 y.o. female  Admitting Dx: Intermittent headache, nausea/vomiting  ASSESSMENT: Pt reports on/off nausea/vomiting for the past 3 weeks. States sometimes she would go 2 days with vomiting and then have a few days off. If she was eating, the emesis would contain food, otherwise it appeared like bile. She was trying to stay hydrated but knew she was getting dehydrated. She tried to eat at least 1 good meal/day which consisted of things like soup, crackers, saltines, and peanut butter. Reports stable weight. C/o feeling weak all over. Performed nutrition focused physical exam and noted pt with mild/moderate wasting in upper arms. Denies any nausea/vomiting today however has not eaten anything so far today. Ordered breakfast.   Height: Ht Readings from Last 1 Encounters:  04/08/13 5\' 10"  (1.778 m)    Weight: Wt Readings from Last 1 Encounters:  04/08/13 141 lb 8 oz (64.184 kg)    Ideal Body Weight: 150 lb   % Ideal Body Weight: 94%  Wt Readings from Last 10 Encounters:  04/08/13 141 lb 8 oz (64.184 kg)  04/08/13 135 lb (61.236 kg)  04/22/11 140 lb (63.504 kg)  04/22/11 140 lb (63.504 kg)    Usual Body Weight: 141 lb  per pt  % Usual Body Weight: 100%  BMI:  Body mass index is 20.3 kg/(m^2).  Estimated Nutritional Needs: Kcal: 1800-1950 Protein: 75-90g Fluid: 1.8-1.9L/day  Skin: Intact  Diet Order: Full Liquid  EDUCATION NEEDS: -No education needs identified at this time   Intake/Output Summary (Last 24 hours) at 04/09/13 1129 Last data filed at 04/09/13 0714  Gross per 24 hour  Intake   1725 ml  Output      0 ml  Net   1725 ml    Last BM: 11/18  Labs:   Recent Labs Lab 04/08/13 1240 04/09/13 0020  NA 138 139  K 3.6 3.9  CL 101 107  CO2 26 27  BUN 10 7  CREATININE 0.89 0.80  CALCIUM 9.5 8.4  GLUCOSE 95 137*    CBG (last 3)  No results found for this basename: GLUCAP,  in the last 72 hours  Scheduled Meds: . amLODipine  10 mg Oral Daily  . buPROPion  300 mg Oral Daily  . diphenhydrAMINE  25 mg Oral Q4H  . estradiol  1 mg Oral Daily  . FLUoxetine  40 mg Oral Daily  . mometasone-formoterol  2 puff Inhalation BID  . oxybutynin  15 mg Oral Daily  . pantoprazole  40 mg Oral Daily  . polyethylene glycol  17 g Oral Daily  . senna-docusate  1 tablet Oral QHS    Continuous Infusions: . dextrose 5 % and 0.9% NaCl 75 mL/hr at 04/09/13 0014    Past Medical History  Diagnosis Date  .  IBS (irritable bowel syndrome)   . Hypertension   . Ebsteins anomaly   . Migraine   . 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 Hypercholesteremia   . Asthma     Past Surgical History  Procedure Laterality Date  . Appendectomy    . Abdominal hysterectomy    . I&d extremity  04/23/2011    Procedure: IRRIGATION AND DEBRIDEMENT EXTREMITY;  Surgeon: Johnette Abraham;  Location: [facility] OR;  Service: Plastics;  Laterality: Left;    Levon Hedger MS, RD, LDN [phone] Pager [phone] After Hours Pager

## 2013-04-09 NOTE — Progress Notes (Signed)
Utilization review completed.  

## 2013-04-09 NOTE — Progress Notes (Signed)
[facility] PROGRESS NOTE  Christina Singleton ZOX:[id number] DOB: 08/10/1960 DOA: 04/08/2013 PCP: Emeterio Reeve, MD  Assessment/Plan: Headache -CT head negative. -No aura, photophobia makes migraines less likely. -no neurologic symptoms. -She has only had 3 episodes in the past 3 weeks. -Advised her to use PRN NSAIDS, get her vision examined and follow a food/symptom diary. -Had improvement with toradol. -Plan for DC home in am.  HTN -Fair control. -Continue home meds.  Code Status: Full Code Family Communication: mother at bedside updated on plan of care  Disposition Plan: Home in am   Consultants:  None   Antibiotics:  None   Subjective: Had a HA earlier today. Much improved at present.  Objective: Filed Vitals:   04/08/13 1924 04/08/13 2323 04/09/13 0550 04/09/13 0937  BP: 156/98 134/79 127/80 125/76  Pulse: 53 56 51   Temp: 97.5 F (36.4 C) 97.7 F (36.5 C) 98.2 F (36.8 C)   TempSrc: Oral Oral Oral   Resp: 16 16 17    Height:  5\' 10"  (1.778 m)    Weight:  64.184 kg (141 lb 8 oz)    SpO2: 99% 99% 98%     Intake/Output Summary (Last 24 hours) at 04/09/13 1444 Last data filed at 04/09/13 0714  Gross per 24 hour  Intake   1725 ml  Output      0 ml  Net   1725 ml   Filed Weights   04/08/13 2323  Weight: 64.184 kg (141 lb 8 oz)    Exam:   General:  AA Ox3, NAD  Cardiovascular: RRR, no M/R?G  Respiratory: CTA B  Abdomen: S/NT/ND/+BS/no masses/organomegaly noted  Extremities: no C/C/E/+pedal pulses   Neurologic:  Intact and non-focal.  Data Reviewed: Basic Metabolic Panel:  Recent Labs Lab 04/08/13 1240 04/09/13 0020  NA 138 139  K 3.6 3.9  CL 101 107  CO2 26 27  GLUCOSE 95 137*  BUN 10 7  CREATININE 0.89 0.80  CALCIUM 9.5 8.4   Liver Function Tests:  Recent Labs Lab 04/08/13 1240  AST 18  ALT 12  ALKPHOS 86  BILITOT 0.3  PROT 7.1  ALBUMIN 4.0    Recent Labs Lab 04/08/13 1240  LIPASE 15   No results  found for this basename: AMMONIA,  in the last 168 hours CBC:  Recent Labs Lab 04/08/13 1240 04/09/13 0020  WBC 10.8* 6.8  NEUTROABS 9.7*  --   HGB 13.5 11.8*  HCT 40.6 36.7  MCV 86.6 86.8  PLT 201 200   Cardiac Enzymes:  Recent Labs Lab 04/08/13 1240  TROPONINI <0.30   BNP (last 3 results) No results found for this basename: PROBNP,  in the last 8760 hours CBG: No results found for this basename: GLUCAP,  in the last 168 hours  No results found for this or any previous visit (from the past 240 hour(s)).   Studies: Ct Head Wo Contrast  04/08/2013   CLINICAL DATA:  Headache, vomiting.  EXAM: CT HEAD WITHOUT CONTRAST  TECHNIQUE: Contiguous axial images were obtained from the base of the skull through the vertex without intravenous contrast.  COMPARISON:  CT scan of February 17, 2011.  FINDINGS: Bony calvarium is intact. No mass effect or midline shift is noted. Ventricular size is within normal limits. There is no evidence of mass lesion, hemorrhage or acute infarction.  IMPRESSION: No gross intracranial abnormality seen.   Electronically Signed   By: Roque Lias M.D.   On: 04/08/2013 21:27   Ct Abdomen  Pelvis W Contrast  04/08/2013   CLINICAL DATA:  Nausea, bilateral flank and abdominal pain.  EXAM: CT ABDOMEN AND PELVIS WITH CONTRAST  TECHNIQUE: Multidetector CT imaging of the abdomen and pelvis was performed using the standard protocol following bolus administration of intravenous contrast.  CONTRAST:  50mL OMNIPAQUE IOHEXOL 300 MG/ML SOLN, OMNIPAQUE IOHEXOL 300 MG/ML SOLN  COMPARISON:  Prior CT urogram 03/20/2009; prior renal ultrasound 03/23/2009  FINDINGS: Lower Chest: Mild dependent atelectasis. Cardiac structures within normal limits for size. No pericardial effusion. Unremarkable visualized distal thoracic esophagus.  Abdomen: Unremarkable CT appearance of the stomach, duodenum, spleen, adrenal glands and pancreas. Normal hepatic contour and morphology. Geographic  hypoattenuation in the left hemi-liver adjacent to the fissure for the falciform ligament is nonspecific but most suggestive of benign focal fatty infiltration. No additional focal solid lesion identified. Gallbladder is unremarkable. No intra or extrahepatic biliary ductal dilatation.  Unremarkable appearance of the bilateral kidneys. No focal solid lesion or hydronephrosis. Nonobstructing 3 stone in the lower pole of the left kidney. Right upper and interpolar simple cysts.  No evidence of obstruction or focal bowel wall thickening. Incidental enteroenteric intussusception in the left upper quadrant measures less than 3.5 cm. No definite obstructing mass identified. The terminal ileum is unremarkable. The appendix is not identified and is likely surgically absent. No free fluid or suspicious adenopathy.  Pelvis: Surgical changes of a bilateral hysterectomy. Indeterminate 3.3 x 2.5 cm intermediate attenuation cyst affiliated with the left ovary. Correlation with the prior CT scan from 2010 is limited due to the absence of IV contrast. There appear to be a left ovarian cyst at that time.  Bones/Soft Tissues: No acute fracture or aggressive appearing lytic or blastic osseous lesion. L5-S1 degenerative disc disease.  Vascular: No significant atherosclerotic vascular disease, aneurysmal dilatation or acute abnormality.  IMPRESSION: 1. No acute abnormality in the abdomen or pelvis to explain the patient's clinical symptoms. 2. Indeterminate 3.3 cm intermediate attenuation cystic structure affiliated with the left ovary. While this is almost certainly benign, the intermediate attenuation on CT scan warrants further nonemergent evaluation with pelvic ultrasound to exclude underlying ovarian neoplasm. 3. Nonobstructing left nephrolithiasis. 4. A 3.2 cm length enteroenteric intussusception in the left upper quadrant is likely an incidental finding.   Electronically Signed   By: Malachy Moan M.D.   On: 04/08/2013 15:27    Dg Abd Acute W/chest  04/08/2013   CLINICAL DATA:  Vomiting.  EXAM: ACUTE ABDOMEN SERIES (ABDOMEN 2 VIEW & CHEST 1 VIEW)  COMPARISON:  02/17/2011.  FINDINGS: Mediastinum and hilar structures are normal. Lungs are clear. Heart size normal. No bony abnormalities noted chest.  Abdominal soft tissues are unremarkable. Punctate calcification of the left kidney most consistent with left nephrolithiasis. No evidence of calcified stones in the distribution of the ureters. Large amount stool is noted throughout the colon suggesting constipation. No free air or bowel distention noted. No acute bony abnormalities noted in bony abdomen or pelvis.  IMPRESSION: 1. Negative chest. 2. Subcentimeter left nephrolithiasis. 3. Constipation.  No bowel distention.   Electronically Signed   By: Maisie Fus  Register   On: 04/08/2013 12:56    Scheduled Meds: . amLODipine  10 mg Oral Daily  . buPROPion  300 mg Oral Daily  . diphenhydrAMINE  25 mg Oral Q4H  . estradiol  1 mg Oral Daily  . FLUoxetine  40 mg Oral Daily  . mometasone-formoterol  2 puff Inhalation BID  . oxybutynin  15 mg Oral Daily  .  pantoprazole  40 mg Oral Daily  . polyethylene glycol  17 g Oral Daily  . senna-docusate  1 tablet Oral QHS   Continuous Infusions: . dextrose 5 % and 0.9% NaCl 75 mL/hr at 04/09/13 1232    Active Problems:   Headache   Nausea & vomiting   Malnutrition of moderate degree    Time spent: 35 minutes    HERNANDEZ ACOSTA,ESTELA  [facility] Pager [phone]  If 7PM-7AM, please contact night-coverage at [email or web address], password [facility] 04/09/2013, 2:44 PM  LOS: 1 day

## 2013-04-10 DIAGNOSIS — E440 Moderate protein-calorie malnutrition: Secondary | ICD-10-CM

## 2013-04-10 NOTE — Discharge Summary (Signed)
Physician Discharge Summary  Christina Singleton ZOX:[id number] DOB: 1960/06/12 DOA: 04/08/2013  PCP: Emeterio Reeve, MD  Admit date: 04/08/2013 Discharge date: 04/10/2013  Time spent: 45 minutes  Recommendations for Outpatient Follow-up:  -Advised to follow up with her PCP in 2 weeks.   Discharge Diagnoses:  Active Problems:   Headache   Nausea & vomiting   Malnutrition of moderate degree   Discharge Condition: Stable and improved  Filed Weights   04/08/13 2323  Weight: 64.184 kg (141 lb 8 oz)    History of present illness:  Christina Singleton is a 52 y.o. female who presents with recurring HA's along with intermittnen vomiting beginning 3 weeks ago.  Happened again a week later, didn't feel "right" so went to see PCP. Pt also developed chest congestion, coughing. Was checked for UTI as she has history as well as h/o IC and found strep in urine and has been on keflex for about 1 week with no imprvoement. Continues to have severe pressure HA, diffuse, no fever, rash, stiff neck. She also has intermittent vague abd pain mostly LLQ With a slight increase in urinary frequency- although hard to tell with IC. Continues to have intermittent vomiting, once this AM, last night, but not over the weekend. Doesn't think this is related to keflex. No CP. No pleurisy. Coughing is improved. Non smoker. Usual OTC meds are not helping any of her symptoms. We were asked to admit her for further evaluation and management.   Hospital Course:   Headache  -CT head negative.  -No aura, photophobia makes migraines less likely.  -no neurologic symptoms.  -She has only had 3 episodes in the past 3 weeks.  -Advised her to use PRN NSAIDS, get her vision examined and follow a food/symptom diary.  -Had improvement with toradol.  -Plan for DC home today.  HTN  -Well controlled -Continue home meds   Procedures:  None   Consultations:  None  Discharge Instructions  Discharge Orders   Future  Orders Complete By Expires   Diet - low sodium heart healthy  As directed    Discontinue IV  As directed    Increase activity slowly  As directed        Medication List    STOP taking these medications       amoxicillin 500 MG capsule  Commonly known as:  AMOXIL     cephALEXin 500 MG capsule  Commonly known as:  KEFLEX      TAKE these medications       albuterol 108 (90 BASE) MCG/ACT inhaler  Commonly known as:  PROVENTIL HFA;VENTOLIN HFA  Inhale 2 puffs into the lungs every 6 (six) hours as needed. wheezing     amLODipine 10 MG tablet  Commonly known as:  NORVASC  Take 10 mg by mouth daily.     baclofen 10 MG tablet  Commonly known as:  LIORESAL  Take 10 mg by mouth 3 (three) times daily as needed for muscle spasms (pain).     buPROPion 150 MG 12 hr tablet  Commonly known as:  WELLBUTRIN SR  Take 150 mg by mouth 2 (two) times daily.     diphenhydrAMINE 25 MG tablet  Commonly known as:  BENADRYL  Take 25 mg by mouth every 4 (four) hours. For her IC     ESTRACE 2 MG tablet  Generic drug:  estradiol  Take 1 mg by mouth daily.     FLUoxetine 40 MG capsule  Commonly known as:  PROZAC  Take 40 mg by mouth daily.     Fluticasone-Salmeterol 250-50 MCG/DOSE Aepb  Commonly known as:  ADVAIR  Inhale 1 puff into the lungs 2 (two) times daily.     montelukast 10 MG tablet  Commonly known as:  SINGULAIR  Take 10 mg by mouth at bedtime as needed (seasonal allergies).     omeprazole 20 MG capsule  Commonly known as:  PRILOSEC  Take 20 mg by mouth as needed (acid reflux).     ondansetron 8 MG disintegrating tablet  Commonly known as:  ZOFRAN-ODT  Take 1 tablet (8 mg total) by mouth every 12 (twelve) hours as needed for nausea.     oxybutynin 15 MG 24 hr tablet  Commonly known as:  DITROPAN XL  Take 15 mg by mouth at bedtime.     polyethylene glycol packet  Commonly known as:  MIRALAX / GLYCOLAX  Take 17 g by mouth daily.       Allergies  Allergen Reactions   . Sulfa Antibiotics Other (See Comments)    hallucinations  . Zithromax [Azithromycin] Other (See Comments)    Made her feel weird       Follow-up Information   Follow up with Emeterio Reeve, MD. Schedule an appointment as soon as possible for a visit in 2 weeks.   Specialty:  Family Medicine   Contact information:   [address] [place] [address] [phone]        The results of significant diagnostics from this hospitalization (including imaging, microbiology, ancillary and laboratory) are listed below for reference.    Significant Diagnostic Studies: Ct Head Wo Contrast  04/08/2013   CLINICAL DATA:  Headache, vomiting.  EXAM: CT HEAD WITHOUT CONTRAST  TECHNIQUE: Contiguous axial images were obtained from the base of the skull through the vertex without intravenous contrast.  COMPARISON:  CT scan of February 17, 2011.  FINDINGS: Bony calvarium is intact. No mass effect or midline shift is noted. Ventricular size is within normal limits. There is no evidence of mass lesion, hemorrhage or acute infarction.  IMPRESSION: No gross intracranial abnormality seen.   Electronically Signed   By: Roque Lias M.D.   On: 04/08/2013 21:27   Ct Abdomen Pelvis W Contrast  04/08/2013   CLINICAL DATA:  Nausea, bilateral flank and abdominal pain.  EXAM: CT ABDOMEN AND PELVIS WITH CONTRAST  TECHNIQUE: Multidetector CT imaging of the abdomen and pelvis was performed using the standard protocol following bolus administration of intravenous contrast.  CONTRAST:  50mL OMNIPAQUE IOHEXOL 300 MG/ML SOLN, OMNIPAQUE IOHEXOL 300 MG/ML SOLN  COMPARISON:  Prior CT urogram 03/20/2009; prior renal ultrasound 03/23/2009  FINDINGS: Lower Chest: Mild dependent atelectasis. Cardiac structures within normal limits for size. No pericardial effusion. Unremarkable visualized distal thoracic esophagus.  Abdomen: Unremarkable CT appearance of the stomach, duodenum, spleen, adrenal glands and  pancreas. Normal hepatic contour and morphology. Geographic hypoattenuation in the left hemi-liver adjacent to the fissure for the falciform ligament is nonspecific but most suggestive of benign focal fatty infiltration. No additional focal solid lesion identified. Gallbladder is unremarkable. No intra or extrahepatic biliary ductal dilatation.  Unremarkable appearance of the bilateral kidneys. No focal solid lesion or hydronephrosis. Nonobstructing 3 stone in the lower pole of the left kidney. Right upper and interpolar simple cysts.  No evidence of obstruction or focal bowel wall thickening. Incidental enteroenteric intussusception in the left upper quadrant measures less than 3.5 cm. No definite obstructing mass identified. The terminal  ileum is unremarkable. The appendix is not identified and is likely surgically absent. No free fluid or suspicious adenopathy.  Pelvis: Surgical changes of a bilateral hysterectomy. Indeterminate 3.3 x 2.5 cm intermediate attenuation cyst affiliated with the left ovary. Correlation with the prior CT scan from 2010 is limited due to the absence of IV contrast. There appear to be a left ovarian cyst at that time.  Bones/Soft Tissues: No acute fracture or aggressive appearing lytic or blastic osseous lesion. L5-S1 degenerative disc disease.  Vascular: No significant atherosclerotic vascular disease, aneurysmal dilatation or acute abnormality.  IMPRESSION: 1. No acute abnormality in the abdomen or pelvis to explain the patient's clinical symptoms. 2. Indeterminate 3.3 cm intermediate attenuation cystic structure affiliated with the left ovary. While this is almost certainly benign, the intermediate attenuation on CT scan warrants further nonemergent evaluation with pelvic ultrasound to exclude underlying ovarian neoplasm. 3. Nonobstructing left nephrolithiasis. 4. A 3.2 cm length enteroenteric intussusception in the left upper quadrant is likely an incidental finding.   Electronically  Signed   By: Malachy Moan M.D.   On: 04/08/2013 15:27   Dg Abd Acute W/chest  04/08/2013   CLINICAL DATA:  Vomiting.  EXAM: ACUTE ABDOMEN SERIES (ABDOMEN 2 VIEW & CHEST 1 VIEW)  COMPARISON:  02/17/2011.  FINDINGS: Mediastinum and hilar structures are normal. Lungs are clear. Heart size normal. No bony abnormalities noted chest.  Abdominal soft tissues are unremarkable. Punctate calcification of the left kidney most consistent with left nephrolithiasis. No evidence of calcified stones in the distribution of the ureters. Large amount stool is noted throughout the colon suggesting constipation. No free air or bowel distention noted. No acute bony abnormalities noted in bony abdomen or pelvis.  IMPRESSION: 1. Negative chest. 2. Subcentimeter left nephrolithiasis. 3. Constipation.  No bowel distention.   Electronically Signed   By: Maisie Fus  Register   On: 04/08/2013 12:56    Microbiology: No results found for this or any previous visit (from the past 240 hour(s)).   Labs: Basic Metabolic Panel:  Recent Labs Lab 04/08/13 1240 04/09/13 0020  NA 138 139  K 3.6 3.9  CL 101 107  CO2 26 27  GLUCOSE 95 137*  BUN 10 7  CREATININE 0.89 0.80  CALCIUM 9.5 8.4   Liver Function Tests:  Recent Labs Lab 04/08/13 1240  AST 18  ALT 12  ALKPHOS 86  BILITOT 0.3  PROT 7.1  ALBUMIN 4.0    Recent Labs Lab 04/08/13 1240  LIPASE 15   No results found for this basename: AMMONIA,  in the last 168 hours CBC:  Recent Labs Lab 04/08/13 1240 04/09/13 0020  WBC 10.8* 6.8  NEUTROABS 9.7*  --   HGB 13.5 11.8*  HCT 40.6 36.7  MCV 86.6 86.8  PLT 201 200   Cardiac Enzymes:  Recent Labs Lab 04/08/13 1240  TROPONINI <0.30   BNP: BNP (last 3 results) No results found for this basename: PROBNP,  in the last 8760 hours CBG: No results found for this basename: GLUCAP,  in the last 168 hours     Signed:  Chaya Jan  [facility] Pager: [phone] 04/10/2013, 1:28  PM

## 2013-05-02 ENCOUNTER — Other Ambulatory Visit (HOSPITAL_COMMUNITY): Payer: Self-pay | Admitting: Gastroenterology

## 2013-05-02 ENCOUNTER — Ambulatory Visit (HOSPITAL_COMMUNITY)
Admission: RE | Admit: 2013-05-02 | Discharge: 2013-05-02 | Disposition: A | Discharge status: Home / Self Care | Payer: BC Managed Care – PPO | Source: Ambulatory Visit | Attending: Gastroenterology | Admitting: Gastroenterology

## 2013-05-02 DIAGNOSIS — R112 Nausea with vomiting, unspecified: Secondary | ICD-10-CM

## 2013-05-02 DIAGNOSIS — R109 Unspecified abdominal pain: Secondary | ICD-10-CM

## 2013-05-02 DIAGNOSIS — R1032 Left lower quadrant pain: Secondary | ICD-10-CM

## 2016-04-19 ENCOUNTER — Other Ambulatory Visit: Payer: Self-pay | Admitting: Sports Medicine

## 2016-04-19 DIAGNOSIS — M545 Low back pain: Secondary | ICD-10-CM

## 2016-04-28 ENCOUNTER — Ambulatory Visit
Admission: RE | Admit: 2016-04-28 | Discharge: 2016-04-28 | Disposition: A | Discharge status: Home / Self Care | Payer: BC Managed Care – PPO | Source: Ambulatory Visit | Attending: Sports Medicine | Admitting: Sports Medicine

## 2016-04-28 DIAGNOSIS — M545 Low back pain: Secondary | ICD-10-CM

## 2018-03-28 ENCOUNTER — Other Ambulatory Visit: Payer: Self-pay | Admitting: Obstetrics and Gynecology

## 2018-03-28 DIAGNOSIS — R928 Other abnormal and inconclusive findings on diagnostic imaging of breast: Secondary | ICD-10-CM

## 2018-04-01 ENCOUNTER — Ambulatory Visit
Admission: RE | Admit: 2018-04-01 | Discharge: 2018-04-01 | Disposition: A | Discharge status: Home / Self Care | Payer: BC Managed Care – PPO | Source: Ambulatory Visit | Attending: Obstetrics and Gynecology | Admitting: Obstetrics and Gynecology

## 2018-04-01 DIAGNOSIS — R928 Other abnormal and inconclusive findings on diagnostic imaging of breast: Secondary | ICD-10-CM

## 2018-05-20 ENCOUNTER — Emergency Department (HOSPITAL_COMMUNITY): Payer: BC Managed Care – PPO

## 2018-05-20 ENCOUNTER — Encounter (HOSPITAL_COMMUNITY): Payer: Self-pay | Admitting: Emergency Medicine

## 2018-05-20 ENCOUNTER — Other Ambulatory Visit: Payer: Self-pay

## 2018-05-20 ENCOUNTER — Emergency Department (HOSPITAL_COMMUNITY)
Admission: EM | Admit: 2018-05-20 | Discharge: 2018-05-20 | Disposition: A | Discharge status: Home / Self Care | Payer: BC Managed Care – PPO | Attending: Emergency Medicine | Admitting: Emergency Medicine

## 2018-05-20 DIAGNOSIS — K5289 Other specified noninfective gastroenteritis and colitis: Secondary | ICD-10-CM | POA: Diagnosis not present

## 2018-05-20 DIAGNOSIS — K529 Noninfective gastroenteritis and colitis, unspecified: Secondary | ICD-10-CM

## 2018-05-20 DIAGNOSIS — I10 Essential (primary) hypertension: Secondary | ICD-10-CM | POA: Diagnosis not present

## 2018-05-20 DIAGNOSIS — R109 Unspecified abdominal pain: Secondary | ICD-10-CM | POA: Diagnosis present

## 2018-05-20 LAB — CBC WITH DIFFERENTIAL/PLATELET
Abs Immature Granulocytes: 0.03 10*3/uL (ref 0.00–0.07)
BASOS ABS: 0 10*3/uL (ref 0.0–0.1)
Basophils Relative: 0 %
EOS PCT: 0 %
Eosinophils Absolute: 0 10*3/uL (ref 0.0–0.5)
HEMATOCRIT: 48 % — AB (ref 36.0–46.0)
Hemoglobin: 15.3 g/dL — ABNORMAL HIGH (ref 12.0–15.0)
Immature Granulocytes: 0 %
Lymphocytes Relative: 16 %
Lymphs Abs: 1.4 10*3/uL (ref 0.7–4.0)
MCH: 27.4 pg (ref 26.0–34.0)
MCHC: 31.9 g/dL (ref 30.0–36.0)
MCV: 86 fL (ref 80.0–100.0)
Monocytes Absolute: 0.6 10*3/uL (ref 0.1–1.0)
Monocytes Relative: 7 %
NRBC: 0 % (ref 0.0–0.2)
Neutro Abs: 6.6 10*3/uL (ref 1.7–7.7)
Neutrophils Relative %: 77 %
Platelets: 154 10*3/uL (ref 150–400)
RBC: 5.58 MIL/uL — ABNORMAL HIGH (ref 3.87–5.11)
RDW: 14.1 % (ref 11.5–15.5)
WBC: 8.7 10*3/uL (ref 4.0–10.5)

## 2018-05-20 LAB — POC OCCULT BLOOD, ED: Fecal Occult Bld: POSITIVE — AB

## 2018-05-20 LAB — TYPE AND SCREEN
ABO/RH(D): AB NEG
Antibody Screen: NEGATIVE

## 2018-05-20 LAB — COMPREHENSIVE METABOLIC PANEL
ALT: 14 U/L (ref 0–44)
AST: 16 U/L (ref 15–41)
Albumin: 3.7 g/dL (ref 3.5–5.0)
Alkaline Phosphatase: 75 U/L (ref 38–126)
Anion gap: 8 (ref 5–15)
BILIRUBIN TOTAL: 0.4 mg/dL (ref 0.3–1.2)
BUN: 10 mg/dL (ref 6–20)
CO2: 26 mmol/L (ref 22–32)
Calcium: 9 mg/dL (ref 8.9–10.3)
Chloride: 107 mmol/L (ref 98–111)
Creatinine, Ser: 0.75 mg/dL (ref 0.44–1.00)
GFR calc Af Amer: >60 mL/min (ref >60)
GFR calc non Af Amer: >60 mL/min (ref >60)
Glucose, Bld: 102 mg/dL — ABNORMAL HIGH (ref 70–99)
POTASSIUM: 3.9 mmol/L (ref 3.5–5.1)
Sodium: 141 mmol/L (ref 135–145)
TOTAL PROTEIN: 6.8 g/dL (ref 6.5–8.1)

## 2018-05-20 LAB — ABO/RH: ABO/RH(D): AB NEG

## 2018-05-20 LAB — LIPASE, BLOOD: Lipase: 27 U/L (ref 11–51)

## 2018-05-20 MED ORDER — LACTATED RINGERS IV BOLUS
1000.0000 mL | Freq: Once | INTRAVENOUS | Status: AC
  Administered 2018-05-20: 1000 mL via INTRAVENOUS

## 2018-05-20 MED ORDER — ONDANSETRON HCL 4 MG/2ML IJ SOLN
4.0000 mg | Freq: Once | INTRAMUSCULAR | Status: AC
  Administered 2018-05-20: 4 mg via INTRAVENOUS
  Filled 2018-05-20: qty 2

## 2018-05-20 MED ORDER — FENTANYL CITRATE (PF) 100 MCG/2ML IJ SOLN
50.0000 ug | Freq: Once | INTRAMUSCULAR | Status: AC
  Administered 2018-05-20: 50 ug via INTRAVENOUS
  Filled 2018-05-20: qty 2

## 2018-05-20 MED ORDER — CIPROFLOXACIN HCL 500 MG PO TABS
500.0000 mg | ORAL_TABLET | Freq: Once | ORAL | Status: AC
  Administered 2018-05-20: 500 mg via ORAL
  Filled 2018-05-20: qty 1

## 2018-05-20 MED ORDER — ONDANSETRON 4 MG PO TBDP: ORAL_TABLET | ORAL | 0 refills | Status: AC

## 2018-05-20 MED ORDER — IOPAMIDOL (ISOVUE-300) INJECTION 61%
100.0000 mL | Freq: Once | INTRAVENOUS | Status: AC | PRN
  Administered 2018-05-20: 100 mL via INTRAVENOUS

## 2018-05-20 MED ORDER — CIPROFLOXACIN HCL 500 MG PO TABS: 500.0000 mg | ORAL_TABLET | Freq: Two times a day (BID) | ORAL | 0 refills | Status: DC

## 2018-05-20 MED ORDER — IOPAMIDOL (ISOVUE-300) INJECTION 61%
INTRAVENOUS | Status: AC
  Filled 2018-05-20: qty 100

## 2018-05-20 MED ORDER — METRONIDAZOLE 500 MG PO TABS: 500.0000 mg | ORAL_TABLET | Freq: Two times a day (BID) | ORAL | 0 refills | Status: AC

## 2018-05-20 MED ORDER — METRONIDAZOLE 500 MG PO TABS
500.0000 mg | ORAL_TABLET | Freq: Once | ORAL | Status: AC
  Administered 2018-05-20: 500 mg via ORAL
  Filled 2018-05-20: qty 1

## 2018-05-20 MED ORDER — SODIUM CHLORIDE (PF) 0.9 % IJ SOLN
INTRAMUSCULAR | Status: AC
  Filled 2018-05-20: qty 50

## 2018-05-20 NOTE — ED Triage Notes (Signed)
Patient reports having coughing, general body aches and weakness. On Friday she was diagnosed with the flu and is currently on medication for it (tamiflu).  After eating a baked potato and 7pm last night, the patient reports having sharp sudden lower left quadrant abdominal pain. Shortly after the pain started, she noticed diarrhea with rectal bleeding and vomiting. The diarrhea began to slow down, but the bright red rectal bleeding with clots persisted. She also reports right flank pain that is achy in nature.

## 2018-05-20 NOTE — ED Provider Notes (Signed)
Emergency Department Provider Note   I have reviewed the triage vital signs and the nursing notes.   HISTORY  Chief Complaint Abdominal Pain; Rectal Bleeding; Emesis; and Diarrhea   HPI Christina Singleton is a 57 y.o. female with multiple medical problems who presents to the emergency department today secondary to abdominal pain, rectal bleeding, diarrhea, nausea and vomiting.  Patient states is been going on for the last few days.  No fevers.  No cough, chest pain.  No syncope but does feel just generally weak and dehydrated.  She has not tried eat or drink anything today.  She has not seen anyone for the symptoms.  She not had this before.  No sick contacts.  No suspicious food intake.  No recent antibiotic use. No other associated or modifying symptoms.    Past Medical History:  Diagnosis Date  . Asthma   . Ebsteins anomaly   . Hypercholesteremia   . Hypertension   . IBS (irritable bowel syndrome)   . Migraine   . 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 Malnutrition of moderate degree (HCC) 04/09/2013  . Headache 04/08/2013  . Nausea & vomiting 04/08/2013    Past Surgical History:  Procedure Laterality Date  . ABDOMINAL HYSTERECTOMY    . APPENDECTOMY    . HAND SURGERY    . I&D EXTREMITY  04/23/2011   Procedure: IRRIGATION AND DEBRIDEMENT EXTREMITY;  Surgeon: Johnette Abraham;  Location: [facility] OR;  Service: Plastics;  Laterality: Left;    Current Outpatient Rx  . Order #: [id number] Class: Historical Med  . Order #: [id number] Class: Historical Med  . Order #: [id number] Class: Historical Med  . Order #: [id number] Class: Historical Med  . Order #: [id number] Class: Historical Med  . Order #: [id number] Class: Historical Med  . Order #: [id number] Class: Historical Med  . Order #: [id number] Class: Historical Med  . Order #: [id number] Class: Historical Med  . Order #: [id number] Class: Print  . Order #:  [id number] Class: Print  . Order #: [id number] Class: Print    Allergies Sulfa antibiotics and Zithromax [azithromycin]  History reviewed. No pertinent family history.  Social History Social History   Tobacco Use  . Smoking status: Never Smoker  . Smokeless tobacco: Never Used  Substance Use Topics  . Alcohol use: Yes    Alcohol/week: 1.0 standard drinks    Types: 1 Glasses of wine per week  . Drug use: No    Review of Systems  All other systems negative except as documented in the HPI. All pertinent positives and negatives as reviewed in the HPI. ____________________________________________   PHYSICAL EXAM:  VITAL SIGNS: ED Triage Vitals  Enc Vitals Group     BP 05/20/18 1130 133/88     Pulse Rate 05/20/18 1117 68     Resp 05/20/18 1116 20     Temp 05/20/18 1116 99 F (37.2 C)     Temp Source 05/20/18 1116 Oral     SpO2 05/20/18 1117 97 %     Weight 05/20/18 1131 140 lb (63.5 kg)     Height 05/20/18 1131 5\' 10"  (1.778 m)    Constitutional: Alert and oriented. Well appearing and in no acute distress. Eyes: Conjunctivae are normal. PERRL. EOMI. Head: Atraumatic. Nose: No congestion/rhinnorhea. Mouth/Throat: Mucous membranes are dry.  Oropharynx non-erythematous. Neck: No stridor.  No meningeal signs.   Cardiovascular: Normal rate, regular rhythm. Good peripheral circulation. Grossly normal heart sounds.  Respiratory: Normal respiratory effort.  No retractions. Lungs CTAB. Gastrointestinal: Soft and usually tender without peritonitis. No distention.  Musculoskeletal: No lower extremity tenderness nor edema. No gross deformities of extremities. Neurologic:  Normal speech and language. No gross focal neurologic deficits are appreciated.  Skin:  Skin is warm, dry and intact. No rash noted.   ____________________________________________   LABS (all labs ordered are listed, but only abnormal results are displayed)  Labs Reviewed  CBC WITH DIFFERENTIAL/PLATELET -  Abnormal; Notable for the following components:      Result Value   RBC 5.58 (*)    Hemoglobin 15.3 (*)    HCT 48.0 (*)    All other components within normal limits  COMPREHENSIVE METABOLIC PANEL - Abnormal; Notable for the following components:   Glucose, Bld 102 (*)    All other components within normal limits  POC OCCULT BLOOD, ED - Abnormal; Notable for the following components:   Fecal Occult Bld POSITIVE (*)    All other components within normal limits  LIPASE, BLOOD  TYPE AND SCREEN  ABO/RH   ____________________________________________  RADIOLOGY  Ct Abdomen Pelvis W Contrast  Result Date: 05/20/2018 CLINICAL DATA:  Coughing. Diffuse myalgias. Sudden onset sharp left lower quadrant abdominal pain last night followed by rectal bleeding and vomiting. Diarrhea. Right flank pain. EXAM: CT ABDOMEN AND PELVIS WITH CONTRAST TECHNIQUE: Multidetector CT imaging of the abdomen and pelvis was performed using the standard protocol following bolus administration of intravenous contrast. CONTRAST:  100mL ISOVUE-300 IOPAMIDOL (ISOVUE-300) INJECTION 61% COMPARISON:  04/08/2013. FINDINGS: Lower chest: Unremarkable. Hepatobiliary: Mild diffuse low density of the liver relative to the spleen. Normal appearing gallbladder. Pancreas: Unremarkable. No pancreatic ductal dilatation or surrounding inflammatory changes. Spleen: Normal in size without focal abnormality. Adrenals/Urinary Tract: Normal appearing adrenal glands. Multiple right renal cysts, including parapelvic cysts. 5 mm lower pole left renal calculus. No bladder or ureteral calculi and no hydronephrosis. The urinary bladder is poorly distended. Stomach/Bowel: Interval diffuse low density wall thickening involving the distal transverse colon, splenic flexure and descending colon with mild pericolonic soft tissue stranding. Single diverticulum at the junction of the descending colon and sigmoid colon. The previously demonstrated left upper quadrant  jejunal intussusception is again demonstrated, extending over a longer segment, without proximal dilatation. Surgically absent appendix. Unremarkable stomach and small bowel. Vascular/Lymphatic: No significant vascular findings are present. No enlarged abdominal or pelvic lymph nodes. Reproductive: Simple appearing left ovarian cyst measuring 3.2 x 3.0 cm on image number 72 series 2, previously 2.5 x 2.4 cm. This currently measures simple fluid in density at 8 Hounsfield units. Surgically absent uterus. No right adnexal mass. Other: Small umbilical hernia containing fat. Musculoskeletal: Lumbar spine degenerative changes. IMPRESSION: 1. Interval acute colitis involving the distal transverse colon, splenic flexure and descending colon. 2. Previously demonstrated left upper quadrant jejunal/jejunal intussusception, currently extending over a longer segment without obstruction. 3. Mild diffuse hepatic steatosis. 4. 5 mm nonobstructing lower pole left renal calculus. 5. Mild increase in size of a left ovarian cyst, currently measuring 3.2 cm in maximum diameter. This is almost certainly benign, but follow up ultrasound is recommended in 1 year according to the Society of Radiologists in Ultrasound 2010 Consensus Conference Statement Grier Rocher(D Levine ET al. Management of Asymptomatic Ovarian and Other Adnexal Cysts Imaged at US: Society of Radiologists in Ultrasound Consensus Conference Statement 2010. Radiology 256 (Sept 2010): 943-954.). Electronically Signed   By: Beckie SaltsSteven  Reid M.D.   On: 05/20/2018 15:24    ____________________________________________   PROCEDURES  Procedure(s) performed:   Procedures   ____________________________________________   INITIAL IMPRESSION / ASSESSMENT AND PLAN / ED COURSE  Patient subsequently found to have colitis.  She does have a history of atrial fibrillation with any also makes her high risk for arterial insufficiency.  Doubt mesenteric ischemia.  She is a little bit old  for first time flare of inflammatory bowel disease but is death in the differential.  I think more likely it is infectious in nature so started antibiotics.  Patient is tolerating p.o.  She feels improved from her abdominal pain and dehydration standpoint at the time of discharge.  Plan for GI follow-up (already has one) to ensure this is clearing up and for any further work-up that might be needed.     Pertinent labs & imaging results that were available during my care of the patient were reviewed by me and considered in my medical decision making (see chart for details).  ____________________________________________  FINAL CLINICAL IMPRESSION(S) / ED DIAGNOSES  Final diagnoses:  Colitis     MEDICATIONS GIVEN DURING THIS VISIT:  Medications  lactated ringers bolus 1,000 mL (0 mLs Intravenous Stopped 05/20/18 1540)  fentaNYL (SUBLIMAZE) injection 50 mcg (50 mcg Intravenous Given 05/20/18 1423)  ondansetron (ZOFRAN) injection 4 mg (4 mg Intravenous Given 05/20/18 1423)  iopamidol (ISOVUE-300) 61 % injection 100 mL (100 mLs Intravenous Contrast Given 05/20/18 1504)  ciprofloxacin (CIPRO) tablet 500 mg (500 mg Oral Given 05/20/18 1609)  metroNIDAZOLE (FLAGYL) tablet 500 mg (500 mg Oral Given 05/20/18 1609)     NEW OUTPATIENT MEDICATIONS STARTED DURING THIS VISIT:  Discharge Medication List as of 05/20/2018  3:51 PM    START taking these medications   Details  ciprofloxacin (CIPRO) 500 MG tablet Take 1 tablet (500 mg total) by mouth 2 (two) times daily., Starting Mon 05/20/2018, Print    metroNIDAZOLE (FLAGYL) 500 MG tablet Take 1 tablet (500 mg total) by mouth 2 (two) times daily for 10 days. One po bid x 7 days, Starting Mon 05/20/2018, Until Thu 05/30/2018, Print        Note:  This note was prepared with assistance of Dragon voice recognition software. Occasional wrong-word or sound-a-like substitutions may have occurred due to the inherent limitations of voice recognition  software.   Marily MemosMesner, Mekayla Soman, MD 05/21/18 [phone]

## 2018-05-20 NOTE — ED Notes (Signed)
Bed: WA03 Expected date:  Expected time:  Means of arrival:  Comments: GI bleed

## 2018-08-21 MED FILL — BUTALBITAL-APAP-CAFFEINE 50: 50-325-40 | 25 days supply | Qty: 48 | Fill #0

## 2018-09-19 MED FILL — BUTALB-ACETAMIN-CAFF 50-325: 50-325-40 | 25 days supply | Qty: 48 | Fill #0

## 2018-10-21 MED FILL — BUTALBITAL-APAP-CAFFEINE 50: 50-325-40 | 28 days supply | Qty: 48 | Fill #1

## 2019-07-19 ENCOUNTER — Ambulatory Visit: Payer: BC Managed Care – PPO | Attending: Internal Medicine

## 2019-07-19 DIAGNOSIS — Z23 Encounter for immunization: Secondary | ICD-10-CM | POA: Insufficient documentation

## 2019-07-19 NOTE — Progress Notes (Signed)
   Covid-19 Vaccination Clinic  Name:  RACHNA SCHONBERGER    MRN: [id number] DOB: 01/17/1961  07/19/2019  Ms. Ganci was observed post Covid-19 immunization for 15 minutes without incidence. She was provided with Vaccine Information Sheet and instruction to access the V-Safe system.   Ms. Bonneau was instructed to call 911 with any severe reactions post vaccine: Marland Kitchen Difficulty breathing  . Swelling of your face and throat  . A fast heartbeat  . A bad rash all over your body  . Dizziness and weakness    Immunizations Administered    Name Date Dose VIS Date Route   Pfizer COVID-19 Vaccine 07/19/2019  1:23 PM 0.3 mL 05/02/2019 Intramuscular   Manufacturer: [employer], [employer]   Lot: [id number]   NDC: [address]-2

## 2019-08-09 ENCOUNTER — Ambulatory Visit: Payer: BC Managed Care – PPO | Attending: Internal Medicine

## 2019-08-09 DIAGNOSIS — Z23 Encounter for immunization: Secondary | ICD-10-CM

## 2019-08-09 NOTE — Progress Notes (Signed)
   Covid-19 Vaccination Clinic  Name:  Christina Singleton    MRN: [id number] DOB: 1960/12/08  08/09/2019  [name] was observed post Covid-19 immunization for 15 minutes without incident. She was provided with Vaccine Information Sheet and instruction to access the V-Safe system.   [name] was instructed to call 911 with any severe reactions post vaccine: Marland Kitchen Difficulty breathing  . Swelling of face and throat  . A fast heartbeat  . A bad rash all over body  . Dizziness and weakness   Immunizations Administered    Name Date Dose VIS Date Route   Pfizer COVID-19 Vaccine 08/09/2019  8:16 AM 0.3 mL 05/02/2019 Intramuscular   Manufacturer: [employer], [employer]   Lot: [id number]   NDC: [address]-4

## 2020-01-06 ENCOUNTER — Other Ambulatory Visit (HOSPITAL_COMMUNITY): Payer: Self-pay | Admitting: Urology

## 2020-07-13 ENCOUNTER — Other Ambulatory Visit: Payer: Self-pay | Admitting: Obstetrics and Gynecology

## 2020-07-13 ENCOUNTER — Other Ambulatory Visit: Payer: Self-pay

## 2020-07-13 ENCOUNTER — Ambulatory Visit
Admission: RE | Admit: 2020-07-13 | Discharge: 2020-07-13 | Disposition: A | Discharge status: Home / Self Care | Payer: BC Managed Care – PPO | Source: Ambulatory Visit | Attending: Obstetrics and Gynecology | Admitting: Obstetrics and Gynecology

## 2020-07-13 DIAGNOSIS — R928 Other abnormal and inconclusive findings on diagnostic imaging of breast: Secondary | ICD-10-CM

## 2020-07-14 ENCOUNTER — Other Ambulatory Visit: Payer: Self-pay | Admitting: Family Medicine

## 2020-07-14 DIAGNOSIS — I10 Essential (primary) hypertension: Secondary | ICD-10-CM

## 2020-07-19 ENCOUNTER — Other Ambulatory Visit (HOSPITAL_COMMUNITY): Payer: Self-pay | Admitting: Urology

## 2020-07-20 ENCOUNTER — Ambulatory Visit
Admission: RE | Admit: 2020-07-20 | Discharge: 2020-07-20 | Disposition: A | Discharge status: Home / Self Care | Payer: BC Managed Care – PPO | Source: Ambulatory Visit | Attending: Family Medicine | Admitting: Family Medicine

## 2020-07-20 DIAGNOSIS — I10 Essential (primary) hypertension: Secondary | ICD-10-CM

## 2020-08-30 ENCOUNTER — Other Ambulatory Visit (HOSPITAL_COMMUNITY): Payer: Self-pay

## 2020-08-30 MED FILL — Butalbital-Acetaminophen-Caffeine Tab 50-325-40 MG: ORAL | 25 days supply | Qty: 48 | Fill #0 | Status: AC

## 2020-09-24 ENCOUNTER — Other Ambulatory Visit (HOSPITAL_COMMUNITY): Payer: Self-pay

## 2020-09-24 MED FILL — Butalbital-Acetaminophen-Caffeine Tab 50-325-40 MG: ORAL | 25 days supply | Qty: 48 | Fill #1 | Status: AC

## 2020-10-19 ENCOUNTER — Other Ambulatory Visit (HOSPITAL_COMMUNITY): Payer: Self-pay

## 2020-10-19 MED FILL — Butalbital-Acetaminophen-Caffeine Tab 50-325-40 MG: ORAL | 25 days supply | Qty: 48 | Fill #2 | Status: AC

## 2020-11-15 ENCOUNTER — Other Ambulatory Visit (HOSPITAL_COMMUNITY): Payer: Self-pay

## 2020-11-15 MED FILL — Butalbital-Acetaminophen-Caffeine Tab 50-325-40 MG: ORAL | 25 days supply | Qty: 48 | Fill #3 | Status: AC

## 2020-12-10 ENCOUNTER — Other Ambulatory Visit (HOSPITAL_COMMUNITY): Payer: Self-pay

## 2020-12-10 MED FILL — Butalbital-Acetaminophen-Caffeine Tab 50-325-40 MG: ORAL | 25 days supply | Qty: 48 | Fill #4 | Status: CN

## 2020-12-10 MED FILL — Butalbital-Acetaminophen-Caffeine Tab 50-325-40 MG: ORAL | 25 days supply | Qty: 48 | Fill #4 | Status: AC

## 2021-01-04 ENCOUNTER — Other Ambulatory Visit (HOSPITAL_COMMUNITY): Payer: Self-pay

## 2021-01-04 MED FILL — Butalbital-Acetaminophen-Caffeine Tab 50-325-40 MG: ORAL | 25 days supply | Qty: 48 | Fill #5 | Status: CN

## 2021-01-04 MED FILL — Butalbital-Acetaminophen-Caffeine Tab 50-325-40 MG: ORAL | 25 days supply | Qty: 48 | Fill #5 | Status: AC

## 2021-03-21 ENCOUNTER — Other Ambulatory Visit (HOSPITAL_COMMUNITY): Payer: Self-pay

## 2021-03-22 ENCOUNTER — Other Ambulatory Visit (HOSPITAL_COMMUNITY): Payer: Self-pay

## 2021-03-23 ENCOUNTER — Other Ambulatory Visit (HOSPITAL_COMMUNITY): Payer: Self-pay

## 2021-03-24 ENCOUNTER — Other Ambulatory Visit (HOSPITAL_COMMUNITY): Payer: Self-pay

## 2021-03-25 ENCOUNTER — Other Ambulatory Visit (HOSPITAL_COMMUNITY): Payer: Self-pay

## 2021-03-25 ENCOUNTER — Other Ambulatory Visit (HOSPITAL_COMMUNITY): Payer: Self-pay | Admitting: Urology

## 2021-03-25 MED ORDER — BUTALBITAL-APAP-CAFFEINE 50-325-40 MG PO TABS
ORAL_TABLET | ORAL | 5 refills | Status: DC
  Filled 2021-03-25: qty 48, 25d supply, fill #0
  Filled 2021-04-19: qty 48, 25d supply, fill #1
  Filled 2021-05-14: qty 48, 25d supply, fill #2
  Filled 2021-06-13: qty 48, 25d supply, fill #3
  Filled 2021-07-08: qty 48, 25d supply, fill #4

## 2021-04-19 ENCOUNTER — Other Ambulatory Visit (HOSPITAL_COMMUNITY): Payer: Self-pay

## 2021-05-15 ENCOUNTER — Other Ambulatory Visit (HOSPITAL_COMMUNITY): Payer: Self-pay

## 2021-05-17 ENCOUNTER — Other Ambulatory Visit (HOSPITAL_COMMUNITY): Payer: Self-pay

## 2021-06-13 ENCOUNTER — Other Ambulatory Visit (HOSPITAL_COMMUNITY): Payer: Self-pay

## 2021-07-08 ENCOUNTER — Other Ambulatory Visit (HOSPITAL_COMMUNITY): Payer: Self-pay

## 2021-07-14 ENCOUNTER — Emergency Department (HOSPITAL_COMMUNITY): Payer: BC Managed Care – PPO

## 2021-07-14 ENCOUNTER — Encounter (HOSPITAL_COMMUNITY)
Admission: EM | Disposition: A | Discharge status: Home / Self Care | Payer: Self-pay | Source: Home / Self Care | Attending: Internal Medicine

## 2021-07-14 ENCOUNTER — Observation Stay (HOSPITAL_COMMUNITY): Payer: BC Managed Care – PPO | Admitting: Certified Registered Nurse Anesthetist

## 2021-07-14 ENCOUNTER — Other Ambulatory Visit: Payer: Self-pay

## 2021-07-14 ENCOUNTER — Inpatient Hospital Stay (HOSPITAL_COMMUNITY)
Admission: EM | Admit: 2021-07-14 | Discharge: 2021-07-17 | DRG: 981 | Disposition: A | Discharge status: Home / Self Care | Payer: BC Managed Care – PPO | Attending: Internal Medicine | Admitting: Internal Medicine

## 2021-07-14 ENCOUNTER — Encounter (HOSPITAL_COMMUNITY): Payer: Self-pay | Admitting: Emergency Medicine

## 2021-07-14 DIAGNOSIS — E7800 Pure hypercholesterolemia, unspecified: Secondary | ICD-10-CM | POA: Diagnosis present

## 2021-07-14 DIAGNOSIS — L089 Local infection of the skin and subcutaneous tissue, unspecified: Secondary | ICD-10-CM | POA: Diagnosis present

## 2021-07-14 DIAGNOSIS — N3281 Overactive bladder: Secondary | ICD-10-CM | POA: Diagnosis present

## 2021-07-14 DIAGNOSIS — F39 Unspecified mood [affective] disorder: Secondary | ICD-10-CM | POA: Diagnosis present

## 2021-07-14 DIAGNOSIS — Z79890 Hormone replacement therapy (postmenopausal): Secondary | ICD-10-CM

## 2021-07-14 DIAGNOSIS — I891 Lymphangitis: Secondary | ICD-10-CM

## 2021-07-14 DIAGNOSIS — Z882 Allergy status to sulfonamides status: Secondary | ICD-10-CM

## 2021-07-14 DIAGNOSIS — Z90710 Acquired absence of both cervix and uterus: Secondary | ICD-10-CM

## 2021-07-14 DIAGNOSIS — L02512 Cutaneous abscess of left hand: Principal | ICD-10-CM | POA: Diagnosis present

## 2021-07-14 DIAGNOSIS — I10 Essential (primary) hypertension: Secondary | ICD-10-CM | POA: Diagnosis present

## 2021-07-14 DIAGNOSIS — Z20822 Contact with and (suspected) exposure to covid-19: Secondary | ICD-10-CM | POA: Diagnosis present

## 2021-07-14 DIAGNOSIS — L03012 Cellulitis of left finger: Secondary | ICD-10-CM | POA: Diagnosis present

## 2021-07-14 DIAGNOSIS — Z881 Allergy status to other antibiotic agents status: Secondary | ICD-10-CM

## 2021-07-14 DIAGNOSIS — N3010 Interstitial cystitis (chronic) without hematuria: Secondary | ICD-10-CM | POA: Diagnosis present

## 2021-07-14 DIAGNOSIS — B954 Other streptococcus as the cause of diseases classified elsewhere: Secondary | ICD-10-CM | POA: Diagnosis present

## 2021-07-14 DIAGNOSIS — Z79899 Other long term (current) drug therapy: Secondary | ICD-10-CM

## 2021-07-14 DIAGNOSIS — J45909 Unspecified asthma, uncomplicated: Secondary | ICD-10-CM | POA: Diagnosis present

## 2021-07-14 DIAGNOSIS — M7989 Other specified soft tissue disorders: Secondary | ICD-10-CM | POA: Diagnosis not present

## 2021-07-14 DIAGNOSIS — Q225 Ebstein's anomaly: Secondary | ICD-10-CM

## 2021-07-14 DIAGNOSIS — G43909 Migraine, unspecified, not intractable, without status migrainosus: Secondary | ICD-10-CM | POA: Diagnosis present

## 2021-07-14 DIAGNOSIS — R519 Headache, unspecified: Secondary | ICD-10-CM | POA: Diagnosis present

## 2021-07-14 DIAGNOSIS — Z8619 Personal history of other infectious and parasitic diseases: Secondary | ICD-10-CM

## 2021-07-14 DIAGNOSIS — D696 Thrombocytopenia, unspecified: Secondary | ICD-10-CM | POA: Diagnosis present

## 2021-07-14 DIAGNOSIS — K589 Irritable bowel syndrome without diarrhea: Secondary | ICD-10-CM | POA: Diagnosis present

## 2021-07-14 HISTORY — DX: Other complications of anesthesia, initial encounter: T88.59XA

## 2021-07-14 HISTORY — PX: I & D EXTREMITY: SHX5045

## 2021-07-14 HISTORY — DX: Other specified postprocedural states: Z98.890

## 2021-07-14 HISTORY — DX: Nausea with vomiting, unspecified: R11.2

## 2021-07-14 HISTORY — DX: Interstitial cystitis (chronic) without hematuria: N30.10

## 2021-07-14 LAB — COMPREHENSIVE METABOLIC PANEL
ALT: 19 U/L (ref 0–44)
AST: 32 U/L (ref 15–41)
Albumin: 3.8 g/dL (ref 3.5–5.0)
Alkaline Phosphatase: 74 U/L (ref 38–126)
Anion gap: 12 (ref 5–15)
BUN: 10 mg/dL (ref 8–23)
CO2: 20 mmol/L — ABNORMAL LOW (ref 22–32)
Calcium: 8.9 mg/dL (ref 8.9–10.3)
Chloride: 103 mmol/L (ref 98–111)
Creatinine, Ser: 0.98 mg/dL (ref 0.44–1.00)
GFR, Estimated: >60 mL/min (ref >60)
Glucose, Bld: 108 mg/dL — ABNORMAL HIGH (ref 70–99)
Potassium: 5.1 mmol/L (ref 3.5–5.1)
Sodium: 135 mmol/L (ref 135–145)
Total Bilirubin: 1.4 mg/dL — ABNORMAL HIGH (ref 0.3–1.2)
Total Protein: 7.2 g/dL (ref 6.5–8.1)

## 2021-07-14 LAB — APTT: aPTT: 30 seconds (ref 24–36)

## 2021-07-14 LAB — RESP PANEL BY RT-PCR (FLU A&B, COVID) ARPGX2
Influenza A by PCR: NEGATIVE
Influenza B by PCR: NEGATIVE
SARS Coronavirus 2 by RT PCR: NEGATIVE

## 2021-07-14 LAB — CBC WITH DIFFERENTIAL/PLATELET
Abs Immature Granulocytes: 0.06 10*3/uL (ref 0.00–0.07)
Basophils Absolute: 0 10*3/uL (ref 0.0–0.1)
Basophils Relative: 0 %
Eosinophils Absolute: 0 10*3/uL (ref 0.0–0.5)
Eosinophils Relative: 0 %
HCT: 43.9 % (ref 36.0–46.0)
Hemoglobin: 14.7 g/dL (ref 12.0–15.0)
Immature Granulocytes: 0 %
Lymphocytes Relative: 9 %
Lymphs Abs: 1.3 10*3/uL (ref 0.7–4.0)
MCH: 29.5 pg (ref 26.0–34.0)
MCHC: 33.5 g/dL (ref 30.0–36.0)
MCV: 88 fL (ref 80.0–100.0)
Monocytes Absolute: 1.5 10*3/uL — ABNORMAL HIGH (ref 0.1–1.0)
Monocytes Relative: 11 %
Neutro Abs: 11.2 10*3/uL — ABNORMAL HIGH (ref 1.7–7.7)
Neutrophils Relative %: 80 %
Platelets: 127 10*3/uL — ABNORMAL LOW (ref 150–400)
RBC: 4.99 MIL/uL (ref 3.87–5.11)
RDW: 13.9 % (ref 11.5–15.5)
WBC: 14.1 10*3/uL — ABNORMAL HIGH (ref 4.0–10.5)
nRBC: 0 % (ref 0.0–0.2)

## 2021-07-14 LAB — PROTIME-INR
INR: 1.1 (ref 0.8–1.2)
Prothrombin Time: 14.1 seconds (ref 11.4–15.2)

## 2021-07-14 LAB — LACTIC ACID, PLASMA: Lactic Acid, Venous: 1.9 mmol/L (ref 0.5–1.9)

## 2021-07-14 SURGERY — IRRIGATION AND DEBRIDEMENT EXTREMITY
Anesthesia: General | Laterality: Left

## 2021-07-14 MED ORDER — BUPROPION HCL ER (XL) 300 MG PO TB24
300.0000 mg | ORAL_TABLET | Freq: Every day | ORAL | Status: DC
  Administered 2021-07-15 – 2021-07-17 (×3): 300 mg via ORAL
  Filled 2021-07-14 (×3): qty 1

## 2021-07-14 MED ORDER — BUPROPION HCL ER (SR) 150 MG PO TB12: 150.0000 mg | ORAL_TABLET | Freq: Two times a day (BID) | ORAL | Status: DC

## 2021-07-14 MED ORDER — LACTATED RINGERS IV BOLUS (SEPSIS)
1000.0000 mL | Freq: Once | INTRAVENOUS | Status: AC
  Administered 2021-07-14: 1000 mL via INTRAVENOUS

## 2021-07-14 MED ORDER — CEFAZOLIN SODIUM-DEXTROSE 2-4 GM/100ML-% IV SOLN
2.0000 g | INTRAVENOUS | Status: DC
  Filled 2021-07-14: qty 100

## 2021-07-14 MED ORDER — POVIDONE-IODINE 10 % EX SWAB: 2.0000 "application " | Freq: Once | CUTANEOUS | Status: DC

## 2021-07-14 MED ORDER — ACETAMINOPHEN 10 MG/ML IV SOLN
INTRAVENOUS | Status: AC
  Filled 2021-07-14: qty 100

## 2021-07-14 MED ORDER — ONDANSETRON HCL 4 MG/2ML IJ SOLN
4.0000 mg | Freq: Once | INTRAMUSCULAR | Status: AC | PRN
  Administered 2021-07-14: 4 mg via INTRAVENOUS

## 2021-07-14 MED ORDER — ACETAMINOPHEN 10 MG/ML IV SOLN
1000.0000 mg | Freq: Once | INTRAVENOUS | Status: DC | PRN
  Administered 2021-07-14: 1000 mg via INTRAVENOUS

## 2021-07-14 MED ORDER — CHLORHEXIDINE GLUCONATE 4 % EX LIQD
60.0000 mL | Freq: Once | CUTANEOUS | Status: AC
  Administered 2021-07-14: 4 via TOPICAL

## 2021-07-14 MED ORDER — PROPOFOL 10 MG/ML IV BOLUS
INTRAVENOUS | Status: AC
  Filled 2021-07-14: qty 20

## 2021-07-14 MED ORDER — LACTATED RINGERS IV SOLN: INTRAVENOUS | Status: DC

## 2021-07-14 MED ORDER — ONDANSETRON HCL 4 MG PO TABS: 4.0000 mg | ORAL_TABLET | Freq: Four times a day (QID) | ORAL | Status: DC | PRN

## 2021-07-14 MED ORDER — BUPIVACAINE HCL (PF) 0.5 % IJ SOLN
INTRAMUSCULAR | Status: DC | PRN
  Administered 2021-07-14: 10 mL

## 2021-07-14 MED ORDER — LACTATED RINGERS IV BOLUS (SEPSIS): 1000.0000 mL | Freq: Once | INTRAVENOUS | Status: DC

## 2021-07-14 MED ORDER — ACETAMINOPHEN 650 MG RE SUPP: 650.0000 mg | Freq: Four times a day (QID) | RECTAL | Status: DC | PRN

## 2021-07-14 MED ORDER — TRAMADOL HCL 50 MG PO TABS
50.0000 mg | ORAL_TABLET | Freq: Once | ORAL | Status: AC
  Administered 2021-07-14: 50 mg via ORAL
  Filled 2021-07-14: qty 1

## 2021-07-14 MED ORDER — OXYBUTYNIN CHLORIDE ER 5 MG PO TB24
15.0000 mg | ORAL_TABLET | Freq: Every day | ORAL | Status: DC
  Administered 2021-07-14 – 2021-07-16 (×3): 15 mg via ORAL
  Filled 2021-07-14 (×3): qty 3

## 2021-07-14 MED ORDER — ONDANSETRON HCL 4 MG/2ML IJ SOLN: 4.0000 mg | Freq: Four times a day (QID) | INTRAMUSCULAR | Status: DC | PRN

## 2021-07-14 MED ORDER — FENTANYL CITRATE (PF) 100 MCG/2ML IJ SOLN
INTRAMUSCULAR | Status: AC
  Filled 2021-07-14: qty 2

## 2021-07-14 MED ORDER — CEFAZOLIN SODIUM-DEXTROSE 2-4 GM/100ML-% IV SOLN
2.0000 g | Freq: Three times a day (TID) | INTRAVENOUS | Status: DC
  Administered 2021-07-14 – 2021-07-15 (×2): 2 g via INTRAVENOUS
  Filled 2021-07-14 (×2): qty 100

## 2021-07-14 MED ORDER — PROPOFOL 10 MG/ML IV BOLUS
INTRAVENOUS | Status: DC | PRN
Start: 2021-07-14 — End: 2021-07-14
  Administered 2021-07-14: 50 mg via INTRAVENOUS
  Administered 2021-07-14: 150 mg via INTRAVENOUS

## 2021-07-14 MED ORDER — HYDROMORPHONE HCL 1 MG/ML IJ SOLN
0.2500 mg | INTRAMUSCULAR | Status: DC | PRN
  Administered 2021-07-14: 0.5 mg via INTRAVENOUS

## 2021-07-14 MED ORDER — SENNOSIDES-DOCUSATE SODIUM 8.6-50 MG PO TABS: 1.0000 | ORAL_TABLET | Freq: Every evening | ORAL | Status: DC | PRN

## 2021-07-14 MED ORDER — ACETAMINOPHEN 325 MG PO TABS
650.0000 mg | ORAL_TABLET | Freq: Four times a day (QID) | ORAL | Status: DC | PRN
  Administered 2021-07-15 – 2021-07-16 (×2): 650 mg via ORAL
  Filled 2021-07-14 (×3): qty 2

## 2021-07-14 MED ORDER — FENTANYL CITRATE (PF) 100 MCG/2ML IJ SOLN
INTRAMUSCULAR | Status: DC | PRN
  Administered 2021-07-14 (×2): 50 ug via INTRAVENOUS

## 2021-07-14 MED ORDER — VANCOMYCIN HCL IN DEXTROSE 1-5 GM/200ML-% IV SOLN
1000.0000 mg | Freq: Once | INTRAVENOUS | Status: AC
  Administered 2021-07-14: 1000 mg via INTRAVENOUS
  Filled 2021-07-14: qty 200

## 2021-07-14 MED ORDER — EPHEDRINE SULFATE-NACL 50-0.9 MG/10ML-% IV SOSY
PREFILLED_SYRINGE | INTRAVENOUS | Status: DC | PRN
  Administered 2021-07-14: 10 mg via INTRAVENOUS

## 2021-07-14 MED ORDER — BUTALBITAL-APAP-CAFFEINE 50-325-40 MG PO TABS
1.0000 | ORAL_TABLET | Freq: Four times a day (QID) | ORAL | Status: DC | PRN
  Administered 2021-07-16 – 2021-07-17 (×2): 1 via ORAL
  Filled 2021-07-14 (×2): qty 1

## 2021-07-14 MED ORDER — VANCOMYCIN HCL IN DEXTROSE 1-5 GM/200ML-% IV SOLN
1000.0000 mg | INTRAVENOUS | Status: DC
  Administered 2021-07-15 – 2021-07-16 (×2): 1000 mg via INTRAVENOUS
  Filled 2021-07-14 (×3): qty 200

## 2021-07-14 MED ORDER — LIDOCAINE 2% (20 MG/ML) 5 ML SYRINGE
INTRAMUSCULAR | Status: DC | PRN
  Administered 2021-07-14: 30 mg via INTRAVENOUS

## 2021-07-14 MED ORDER — FENTANYL CITRATE PF 50 MCG/ML IJ SOSY
100.0000 ug | PREFILLED_SYRINGE | INTRAMUSCULAR | Status: DC | PRN
  Administered 2021-07-14: 100 ug via INTRAVENOUS
  Filled 2021-07-14: qty 2

## 2021-07-14 MED ORDER — ONDANSETRON HCL 4 MG/2ML IJ SOLN
INTRAMUSCULAR | Status: DC | PRN
Start: 2021-07-14 — End: 2021-07-14
  Administered 2021-07-14: 4 mg via INTRAVENOUS

## 2021-07-14 MED ORDER — ASCORBIC ACID 500 MG PO TABS
1000.0000 mg | ORAL_TABLET | Freq: Every day | ORAL | Status: DC
  Administered 2021-07-15 – 2021-07-17 (×3): 1000 mg via ORAL
  Filled 2021-07-14 (×3): qty 2

## 2021-07-14 MED ORDER — HYDROMORPHONE HCL 1 MG/ML IJ SOLN
INTRAMUSCULAR | Status: AC
  Filled 2021-07-14: qty 1

## 2021-07-14 MED ORDER — MEPERIDINE HCL 50 MG/ML IJ SOLN: 6.2500 mg | INTRAMUSCULAR | Status: DC | PRN

## 2021-07-14 MED ORDER — GLYCOPYRROLATE 0.2 MG/ML IJ SOLN
INTRAMUSCULAR | Status: DC | PRN
  Administered 2021-07-14: .2 mg via INTRAVENOUS

## 2021-07-14 MED ORDER — ONDANSETRON HCL 4 MG/2ML IJ SOLN
INTRAMUSCULAR | Status: AC
  Filled 2021-07-14: qty 2

## 2021-07-14 MED ORDER — PHENYLEPHRINE 40 MCG/ML (10ML) SYRINGE FOR IV PUSH (FOR BLOOD PRESSURE SUPPORT)
PREFILLED_SYRINGE | INTRAVENOUS | Status: DC | PRN
  Administered 2021-07-14: 160 ug via INTRAVENOUS

## 2021-07-14 MED ORDER — BUPIVACAINE HCL (PF) 0.5 % IJ SOLN
INTRAMUSCULAR | Status: AC
  Filled 2021-07-14: qty 30

## 2021-07-14 MED ORDER — GLYCOPYRROLATE 0.2 MG/ML IJ SOLN
INTRAMUSCULAR | Status: AC
  Filled 2021-07-14: qty 1

## 2021-07-14 MED ORDER — ONDANSETRON HCL 4 MG/2ML IJ SOLN
4.0000 mg | Freq: Once | INTRAMUSCULAR | Status: AC
  Administered 2021-07-14: 4 mg via INTRAVENOUS
  Filled 2021-07-14: qty 2

## 2021-07-14 MED ORDER — CEFAZOLIN SODIUM-DEXTROSE 2-4 GM/100ML-% IV SOLN
2.0000 g | Freq: Once | INTRAVENOUS | Status: AC
  Administered 2021-07-14: 2 g via INTRAVENOUS
  Filled 2021-07-14: qty 100

## 2021-07-14 MED ORDER — ENOXAPARIN SODIUM 40 MG/0.4ML IJ SOSY
40.0000 mg | PREFILLED_SYRINGE | INTRAMUSCULAR | Status: DC
  Administered 2021-07-15 – 2021-07-17 (×3): 40 mg via SUBCUTANEOUS
  Filled 2021-07-14 (×3): qty 0.4

## 2021-07-14 MED ORDER — HYDROCODONE-ACETAMINOPHEN 5-325 MG PO TABS: 1.0000 | ORAL_TABLET | ORAL | Status: DC | PRN

## 2021-07-14 MED ORDER — DEXAMETHASONE SODIUM PHOSPHATE 10 MG/ML IJ SOLN
INTRAMUSCULAR | Status: DC | PRN
Start: 2021-07-14 — End: 2021-07-14
  Administered 2021-07-14: 8 mg via INTRAVENOUS

## 2021-07-14 SURGICAL SUPPLY — 43 items
BAG COUNTER SPONGE SURGICOUNT (BAG) IMPLANT
BAG SPEC THK2 15X12 ZIP CLS (MISCELLANEOUS) ×1
BAG SPNG CNTER NS LX DISP (BAG)
BAG ZIPLOCK 12X15 (MISCELLANEOUS) ×2 IMPLANT
BNDG COHESIVE 4X5 TAN ST LF (GAUZE/BANDAGES/DRESSINGS) ×2 IMPLANT
BNDG ELASTIC 3X5.8 VLCR STR LF (GAUZE/BANDAGES/DRESSINGS) ×2 IMPLANT
BNDG ELASTIC 4X5.8 VLCR STR LF (GAUZE/BANDAGES/DRESSINGS) ×2 IMPLANT
BNDG GAUZE ELAST 4 BULKY (GAUZE/BANDAGES/DRESSINGS) ×2 IMPLANT
CANNULA VESSEL W/WING WO/VALVE (CANNULA) ×2 IMPLANT
CLEANER TIP ELECTROSURG 2X2 (MISCELLANEOUS) ×2 IMPLANT
CORD BIPOLAR FORCEPS 12FT (ELECTRODE) ×2 IMPLANT
COVER SURGICAL LIGHT HANDLE (MISCELLANEOUS) ×2 IMPLANT
CUFF TOURN SGL QUICK 18X4 (TOURNIQUET CUFF) ×2 IMPLANT
CUFF TOURN SGL QUICK 24 (TOURNIQUET CUFF) ×2
CUFF TRNQT CYL 24X4X16.5-23 (TOURNIQUET CUFF) ×1 IMPLANT
DRAIN PENROSE 0.5X18 (DRAIN) ×2 IMPLANT
DRSG PAD ABDOMINAL 8X10 ST (GAUZE/BANDAGES/DRESSINGS) ×2 IMPLANT
ELECT REM PT RETURN 15FT ADLT (MISCELLANEOUS) ×2 IMPLANT
GAUZE PACKING IODOFORM 1/4X15 (PACKING) ×2 IMPLANT
GAUZE SPONGE 4X4 12PLY STRL (GAUZE/BANDAGES/DRESSINGS) ×2 IMPLANT
GAUZE XEROFORM 5X9 LF (GAUZE/BANDAGES/DRESSINGS) ×2 IMPLANT
GLOVE SRG 8 PF TXTR STRL LF DI (GLOVE) ×1 IMPLANT
GLOVE SURG ENC MOIS LTX SZ7.5 (GLOVE) ×4 IMPLANT
GLOVE SURG UNDER POLY LF SZ8 (GLOVE) ×2
GOWN SPEC L3 XXLG W/TWL (GOWN DISPOSABLE) ×2 IMPLANT
HANDPIECE INTERPULSE COAX TIP (DISPOSABLE) ×2
KIT BASIN OR (CUSTOM PROCEDURE TRAY) ×2 IMPLANT
KIT TURNOVER KIT A (KITS) IMPLANT
MANIFOLD NEPTUNE II (INSTRUMENTS) ×2 IMPLANT
PACK ORTHO EXTREMITY (CUSTOM PROCEDURE TRAY) ×2 IMPLANT
PAD CAST 4YDX4 CTTN HI CHSV (CAST SUPPLIES) ×1 IMPLANT
PADDING CAST ABS 3INX4YD NS (CAST SUPPLIES) ×2
PADDING CAST ABS COTTON 3X4 (CAST SUPPLIES) IMPLANT
PADDING CAST COTTON 4X4 STRL (CAST SUPPLIES) ×2
PENCIL SMOKE EVACUATOR (MISCELLANEOUS) IMPLANT
SET HNDPC FAN SPRY TIP SCT (DISPOSABLE) ×1 IMPLANT
SPLINT PLASTER CAST XFAST 5X30 (CAST SUPPLIES) IMPLANT
SPLINT PLASTER XFAST SET 5X30 (CAST SUPPLIES) ×1
SUT ETHILON 4 0 PS 2 18 (SUTURE) ×2 IMPLANT
SUT SILK 4 0 PS 2 (SUTURE) ×2 IMPLANT
SYR 20ML LL LF (SYRINGE) ×2 IMPLANT
SYR CONTROL 10ML LL (SYRINGE) ×2 IMPLANT
TUBE FEEDING 8FR 16IN STR KANG (MISCELLANEOUS) ×1 IMPLANT

## 2021-07-14 NOTE — Anesthesia Postprocedure Evaluation (Signed)
Anesthesia Post Note  Patient: Christina Singleton  Procedure(s) Performed: IRRIGATION AND DEBRIDEMENT EXTREMITY (Left)     Patient location during evaluation: PACU Anesthesia Type: General Level of consciousness: awake Pain management: pain level controlled Vital Signs Assessment: post-procedure vital signs reviewed and stable Cardiovascular status: stable Postop Assessment: no apparent nausea or vomiting Anesthetic complications: no   No notable events documented.  Last Vitals:  Vitals:   07/14/21 1922 07/14/21 1930  BP: 130/81 139/83  Pulse: 76 76  Resp: 13 11  Temp: 36.9 C   SpO2: 99% 99%    Last Pain:  Vitals:   07/14/21 1930  TempSrc:   PainSc: 7                  Orbie Grupe

## 2021-07-14 NOTE — Progress Notes (Signed)
Postop note: Status post incision and drainage left thumb including flexor sheath, MP joint, IP joint.  Gross purulence in the flexor tendon sheath.  IV antibiotics.  Start hydrotherapy in 2 to 4 days.  Okay for discharge home when white blood count normalizing, afebrile, pain controlled.  Okay to start DVT prophylaxis.  Follow-up in office next week.

## 2021-07-14 NOTE — Progress Notes (Signed)
Pharmacy Antibiotic Note  Christina Singleton is a 61 y.o. female admitted on 07/14/2021 with moderate to severe hand infection with purulence.  Pharmacy has been consulted for ancef and vancomycin dosing.  Plan: Ancef 2gm IV q8h  Continue vancomycin as ordered earlier today  Height: 5\' 9"  (175.3 cm) Weight: 65.4 kg (144 lb 2.9 oz) IBW/kg (Calculated) : 66.2  Temp (24hrs), Avg:98.8 F (37.1 C), Min:98.1 F (36.7 C), Max:99.7 F (37.6 C)  Recent Labs  Lab 07/14/21 1105  WBC 14.1*  CREATININE 0.98  LATICACIDVEN 1.9    Estimated Creatinine Clearance: 62.2 mL/min (by C-G formula based on SCr of 0.98 mg/dL).    Allergies  Allergen Reactions   Sulfa Antibiotics Other (See Comments)    hallucinations   Zithromax [Azithromycin] Other (See Comments)    Made her feel weird     Thank you for allowing pharmacy to be a part of this patients care.  07/16/21 RPh 07/14/2021, 10:51 PM

## 2021-07-14 NOTE — Op Note (Signed)
NAME: BAYLA MCGOVERN MEDICAL RECORD NO: [id number] DATE OF BIRTH: March 27, 1961 FACILITY: [facility] LOCATION: [facility] PHYSICIAN: Tami Ribas, MD   OPERATIVE REPORT   DATE OF PROCEDURE: 07/14/21    PREOPERATIVE DIAGNOSIS: Left thumb infection possibly including flexor sheath and MP and IP joints   POSTOPERATIVE DIAGNOSIS: Left thumb flexor sheath infection   PROCEDURE: 1.  Left thumb incision and drainage flexor sheath infection 2.  Left thumb incision and drainage IP joint 3.  Left thumb incision and drainage MP joint   SURGEON:  Betha Loa, M.D.   ASSISTANT: none   ANESTHESIA:  General   INTRAVENOUS FLUIDS:  Per anesthesia flow sheet.   ESTIMATED BLOOD LOSS:  Minimal.   COMPLICATIONS:  None.   SPECIMENS: Cultures to micro   TOURNIQUET TIME:    Total Tourniquet Time Documented: Upper Arm (Left) -less than 60 minutes Total: Upper Arm (Left) -less than 60 minutes    DISPOSITION:  Stable to PACU.   INDICATIONS: 61 year old female has had 1 day of increasing swelling pain and erythema of the left thumb.  She has had some fevers as well.  Pain with motion of the thumb.  She presented to the emergency department.  She was found to have a high white count.  On exam she had tenderness along the flexor sheath and pain with motion of the thumb.  She was also exquisitely tender at the MP and IP joints.  She wished to proceed with operative incision and drainage of the thumb including the flexor sheath and possibly MP and IP joints.  Risks, benefits and alternatives of surgery were discussed including the risks of blood loss, infection, damage to nerves, vessels, tendons, ligaments, bone for surgery, need for additional surgery, complications with wound healing, continued pain, stiffness, , need for repeat irrigation and debridement.  She voiced understanding of these risks and elected to proceed.  OPERATIVE COURSE:  After being identified preoperatively by myself,  the patient and  I agreed on the procedure and site of the procedure.  The surgical site was marked.  Surgical consent had been signed. She was given IV antibiotics as preoperative antibiotic prophylaxis. She was transferred to the operating room and placed on the operating table in supine position with the Left upper extremity on an arm board.  General anesthesia was induced by the anesthesiologist.  Left upper extremity was prepped and draped in normal sterile orthopedic fashion.  A surgical pause was performed between the surgeons, anesthesia, and operating room staff and all were in agreement as to the patient, procedure, and site of procedure.  Tourniquet at the proximal aspect of the extremity was inflated to 250 mmHg after exsanguination of the arm with an Esmarch bandage.  Incision was made at the volar aspect of the distal phalanx of the thumb.  This was carried into the subcutaneous tissues by spreading technique.  There was gross purulence coming from the flexor sheath.  An incision was made transversely at the proximal flexion crease of the thumb.  This was through the skin only.  The radial and ulnar digital nerves were identified and protected throughout the case.  The A1 pulley was opened.  There was gross purulence within the tendon sheath.  Cultures were taken for aerobes and anaerobes.  An additional incision was made over the FCR tendon at the wrist.  This was again carried into subcutaneous tissues by spreading technique.  Bipolar electrocautery was used to obtain hemostasis.  The FPL tendon was identified and  the sheath opened.  A #8 pediatric feeding tube was advanced into the flexor sheath from the wound at the proximal aspect of the thumb.  This was used to irrigate the sheath with copious amounts of sterile saline.  Good effluent was obtained from both the proximal and distal wounds.  The feeding tube was then placed proximally from the wound at the proximal aspect of the thumb.  This was used to irrigate  the sheath at the wrist.  Good effluent was obtained from both the middle wound and the wound at the wrist.  Incision was made at the radial side of the IP joint and MP joints of the thumb.  These were carried in subcutaneous tissues by spreading technique.  The IP joint was entered underneath the extensor tendon.  There was thin clear fluid but no purulence within the wound.  The MP joint was entered underneath the extensor tendon as well after making a small rent in the aponeurosis.  Again thin clear fluid was encountered but no gross purulence.  The MP and IP joints were copiously irrigated with sterile saline with the pediatric feeding tube after cutting off the previously used portion.  The wounds were all copiously irrigated with sterile saline.  They were then packed with quarter inch iodoform gauze.  Digital block was performed with half percent plain Marcaine to aid in postoperative analgesia.  The proximal wound was injected as well.  The wounds were all dressed with sterile 4 x 4's and wrapped with a Kerlix bandage.  A thumb spica splint was placed and wrapped with Kerlix and Ace bandage.  Vancomycin was given in the OR after obtaining cultures.  The tourniquet was deflated at less than 60 minutes.  Fingertips were pink with brisk capillary refill after deflation of tourniquet.  The operative  drapes were broken down.  The patient was awoken from anesthesia safely.  She was transferred back to the stretcher and taken to PACU in stable condition.  She is admitted to the hospitalist for IV antibiotics.  Betha Loa, MD Electronically signed, 07/14/21

## 2021-07-14 NOTE — Assessment & Plan Note (Signed)
Continue Fioricet.  No headache right now.

## 2021-07-14 NOTE — Assessment & Plan Note (Signed)
Blood pressure relatively stable. Currently holding blood pressure medications.

## 2021-07-14 NOTE — Assessment & Plan Note (Addendum)
Pyrogens.Presents with spontaneous swelling of left temple and hand without any injury or trauma. Hand surgery consulted, underwent IND on 2/24. Patient had similar infection in the past requiring I&D x2 where cultures grew staph. Blood cultures so far negative. Wound culture growing strep pyogenes. Currently on IV vancomycin.  Will discuss with ID tomorrow.

## 2021-07-14 NOTE — ED Triage Notes (Signed)
Pt reports SHOB, body aches, chills, no appetite since yesterday. Pt also reports swollen and painful hand that she noticed yesterday as well. Pt denies injury.

## 2021-07-14 NOTE — H&P (Signed)
History and Physical    Patient: Christina Singleton:[id number] DOB: 1960-09-14 DOA: 07/14/2021 DOS: the patient was seen and examined on 07/14/2021 PCP: Mila Palmer, MD  Patient coming from: Home  Chief Complaint:  Chief Complaint  Patient presents with   Shortness of Breath   Generalized Body Aches   Hand Pain    HPI: Christina Singleton is a 61 y.o. female with medical history significant of mood disorder, HTN, HLD, staph infection of left upper extremity for injury in 2012. Patient presents with complaints of spontaneous swelling of left thumb started this morning associated with fever and chills as well as body ache. No injury reported by the patient.  No insect bite reported by the patient.  No trauma reported by the patient. No nausea or vomiting.  No fever or chills at the time of my evaluation.  No chest pain no abdominal pain.  No diarrhea or constipation.  Review of Systems: As mentioned in the history of present illness. All other systems reviewed and are negative. Past Medical History:  Diagnosis Date   Asthma    Complication of anesthesia    Ebsteins anomaly    Hypercholesteremia    Hypertension    IBS (irritable bowel syndrome)    Interstitial cystitis    since approximately 2003   Migraine    Non-O157 Shiga toxin-producing Escherichia coli (E.coli)    PONV (postoperative nausea and vomiting)    Past Surgical History:  Procedure Laterality Date   ABDOMINAL HYSTERECTOMY     APPENDECTOMY     HAND SURGERY     I & D EXTREMITY  04/23/2011   Procedure: IRRIGATION AND DEBRIDEMENT EXTREMITY;  Surgeon: Johnette Abraham;  Location: [facility] OR;  Service: Plastics;  Laterality: Left;   Social History:  reports that she has never smoked. She has never used smokeless tobacco. She reports current alcohol use of about 1.0 standard drink per week. She reports that she does not use drugs.  Allergies  Allergen Reactions   Sulfa Antibiotics Other (See Comments)     hallucinations   Zithromax [Azithromycin] Other (See Comments)    Made her feel weird    History reviewed. No pertinent family history.  Prior to Admission medications   Medication Sig Start Date End Date Taking? Authorizing Provider  albuterol (PROVENTIL HFA;VENTOLIN HFA) 108 (90 BASE) MCG/ACT inhaler Inhale 2 puffs into the lungs every 6 (six) hours as needed. wheezing     [provider]  amLODipine (NORVASC) 10 MG tablet Take 10 mg by mouth daily.      [provider]  amLODipine-valsartan (EXFORGE) 10-160 MG tablet Take 1 tablet by mouth daily. 06/30/21   [provider]  buPROPion (WELLBUTRIN SR) 150 MG 12 hr tablet Take 150 mg by mouth 2 (two) times daily.      [provider]  buPROPion (WELLBUTRIN XL) 300 MG 24 hr tablet Take 300 mg by mouth every morning. 06/30/21   [provider]  butalbital-acetaminophen-caffeine (FIORICET) 50-325-40 MG tablet TAKE 1 TABLET BY MOUTH EVERY 6 (SIX) HOURS AS NEEDED FOR PAIN. 07/19/20 07/19/21  Jamison Neighbor, MD  butalbital-acetaminophen-caffeine (FIORICET) [phone] MG tablet Take 1 tablet by mouth every 6 hours as needed for pain 03/25/21     butalbital-acetaminophen-caffeine (FIORICET, ESGIC) 50-325-40 MG tablet Take 1 tablet by mouth every 6 (six) hours as needed for headache. 11/01/17   [provider]  diphenhydrAMINE (BENADRYL) 25 MG tablet Take 25 mg by mouth every 4 (four) hours as  needed for itching or allergies.     [provider]  estradiol (ESTRACE) 2 MG tablet Take 1 mg by mouth daily.      [provider]  FLUoxetine (PROZAC) 40 MG capsule Take 40 mg by mouth daily.      [provider]  nitrofurantoin (MACRODANTIN) 50 MG capsule TAKE ONE CAPSULE BY MOUTH EVERY EVENING 07/19/20 07/19/21  Jamison Neighbor, MD  ondansetron (ZOFRAN ODT) 4 MG disintegrating tablet 4mg  ODT q4 hours prn nausea/vomit 05/20/18   Mesner, 05/22/18, MD  ondansetron (ZOFRAN) 8 MG tablet Take 8 mg  by mouth every 8 (eight) hours as needed for nausea or vomiting.    [provider]  oxybutynin (DITROPAN XL) 15 MG 24 hr tablet Take 15 mg by mouth at bedtime.    [provider]    Physical Exam: Vitals:   07/14/21 1300 07/14/21 1553 07/14/21 1600 07/14/21 1922  BP: 114/71  123/78 130/81  Pulse: (!) 55  68   Resp: 17  14   Temp:   99.7 F (37.6 C) 98.4 F (36.9 C)  TempSrc:   Oral   SpO2: 97%  98%   Weight:  61.2 kg    Height:  5\' 9"  (1.753 m)     General: Appear in mild distress, no Rash; Oral Mucosa Clear, moist. no Abnormal Neck Mass Or lumps, Conjunctiva normal  Cardiovascular: S1 and S2 Present, no Murmur, Respiratory: good respiratory effort, Bilateral Air entry present and CTA, no Crackles, no wheezes Abdomen: Bowel Sound present, Soft and no tenderness Extremities: no Pedal edema, significant swelling of left index and thumb with redness and streaking involving forearm. Neurology: alert and oriented to time, place, and person affect appropriate. no new focal deficit Gait not checked due to patient safety concerns     Data Reviewed:  I have Reviewed nursing notes, Vitals, and Lab results since pt's last encounter. Pertinent lab results CBC, BMP, x-ray of left upper extremity I have ordered test including CBC, BMP I have reviewed the last note from hand surgery,    Assessment and Plan: * Abscess of left thumb- (present on admission) Presents with spontaneous swelling of left temple and hand without any injury or trauma. Hand surgery evaluated patient at bedside and recommend I&D. Patient had similar infection in the past requiring I&D x2 where cultures grew staph. At present I would initiate treatment with IV vancomycin. Continue with IV fluids. Follow-up on blood cultures. Requesting orthopedic surgery to send abscess culture. Pain management and DVT prophylaxis per orthopedic surgery.  Overactive bladder- (present on admission) On oxybutynin.   Continue.  Mood disorder (HCC)- (present on admission) On Wellbutrin.  Continue.  Essential hypertension- (present on admission) Blood pressure relatively stable. Currently holding blood pressure medications.  Headache- (present on admission) Continue Fioricet.  No headache right now.       Advance Care Planning:   Code Status: Full Code   Consults: Hand surgery, Dr. 07/16/21  Family Communication: none  Severity of Illness: The appropriate patient status for this patient is OBSERVATION. Observation status is judged to be reasonable and necessary in order to provide the required intensity of service to ensure the patient's safety. The patient's presenting symptoms, physical exam findings, and initial radiographic and laboratory data in the context of their medical condition is felt to place them at decreased risk for further clinical deterioration. Furthermore, it is anticipated that the patient will be medically stable for discharge from the hospital within 2 midnights of  admission.   Author: Lynden Oxford, MD 07/14/2021 7:32 PM  For on call review [email or web address].

## 2021-07-14 NOTE — Assessment & Plan Note (Signed)
On Wellbutrin.  Continue.

## 2021-07-14 NOTE — Transfer of Care (Signed)
Immediate Anesthesia Transfer of Care Note  Patient: Christina Singleton  Procedure(s) Performed: Procedure(s): IRRIGATION AND DEBRIDEMENT EXTREMITY (Left)  Patient Location: PACU  Anesthesia Type:General  Level of Consciousness: Alert, Awake, Oriented  Airway & Oxygen Therapy: Patient Spontanous Breathing  Post-op Assessment: Report given to RN  Post vital signs: Reviewed and stable  Last Vitals:  Vitals:   07/14/21 1300 07/14/21 1600  BP: 114/71 123/78  Pulse: (!) 55 68  Resp: 17 14  Temp:  37.6 C  SpO2: 97% 98%    Complications: No apparent anesthesia complications

## 2021-07-14 NOTE — Sepsis Progress Note (Signed)
[facility] is following this code sepsis ?

## 2021-07-14 NOTE — Consult Note (Addendum)
Reason for Consult:Left thumb infection Referring Physician: Mancel Bale Time called: 1232 Time at bedside: 1350   Christina Singleton is an 61 y.o. female.  HPI: Alajah began to have pain and swelling of her left thumb yesterday on the way home from work. She also quickly developed chills, fever, N/V. She presented to the ED today and hand surgery was consulted. She had a similar experience in her right hand about 10y ago that required I&D. She denies any known trauma. 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 She is RHD.  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 No pertinent family history.  Social History:  reports that she has never smoked. She has never used smokeless tobacco. She reports current alcohol use of about 1.0 standard drink per week. She reports that she does not use drugs.  Allergies:  Allergies  Allergen Reactions   Sulfa Antibiotics Other (See Comments)    hallucinations   Zithromax [Azithromycin] Other (See Comments)    Made her feel weird    Medications: I have reviewed the patient's current medications.  Results for orders placed or performed during the hospital encounter of 07/14/21 (from the past 48 hour(s))  Lactic acid, plasma     Status: None   Collection Time: 07/14/21 11:05 AM  Result Value Ref Range   Lactic Acid, Venous 1.9 0.5 - 1.9 mmol/L    Comment: Performed at [facility], [address], [place], [place] [address]  Comprehensive metabolic panel      Status: Abnormal   Collection Time: 07/14/21 11:05 AM  Result Value Ref Range   Sodium 135 135 - 145 mmol/L   Potassium 5.1 3.5 - 5.1 mmol/L   Chloride 103 98 - 111 mmol/L   CO2 20 (L) 22 - 32 mmol/L   Glucose, Bld 108 (H) 70 - 99 mg/dL    Comment: Glucose reference range applies only to samples taken after fasting for at least 8 hours.   BUN 10 8 - 23 mg/dL   Creatinine, Ser 0.98 0.44 - 1.00 mg/dL   Calcium 8.9 8.9 - 11.9 mg/dL   Total Protein 7.2 6.5 - 8.1 g/dL   Albumin 3.8 3.5 - 5.0 g/dL   AST 32 15 - 41 U/L   ALT 19 0 - 44 U/L   Alkaline Phosphatase 74 38 - 126 U/L   Total Bilirubin 1.4 (H) 0.3 - 1.2 mg/dL   GFR, Estimated >14 >78 mL/min    Comment: (NOTE) Calculated using the CKD-EPI Creatinine Equation (2021)    Anion gap 12 5 - 15    Comment: Performed at [facility], [address], [place], [place] [address]  CBC WITH DIFFERENTIAL     Status: Abnormal   Collection Time: 07/14/21 11:05 AM  Result Value Ref Range   WBC 14.1 (H) 4.0 - 10.5 K/uL   RBC 4.99 3.87 - 5.11 MIL/uL   Hemoglobin  14.7 12.0 - 15.0 g/dL   HCT 52.7 78.2 - 42.3 %   MCV 88.0 80.0 - 100.0 fL   MCH 29.5 26.0 - 34.0 pg   MCHC 33.5 30.0 - 36.0 g/dL   RDW 53.6 14.4 - 31.5 %   Platelets 127 (L) 150 - 400 K/uL   nRBC 0.0 0.0 - 0.2 %   Neutrophils Relative % 80 %   Neutro Abs 11.2 (H) 1.7 - 7.7 K/uL   Lymphocytes Relative 9 %   Lymphs Abs 1.3 0.7 - 4.0 K/uL   Monocytes Relative 11 %   Monocytes Absolute 1.5 (H) 0.1 - 1.0 K/uL   Eosinophils Relative 0 %   Eosinophils Absolute 0.0 0.0 - 0.5 K/uL   Basophils Relative 0 %   Basophils Absolute 0.0 0.0 - 0.1 K/uL   Immature Granulocytes 0 %   Abs Immature Granulocytes 0.06 0.00 - 0.07 K/uL    Comment: Performed at [facility], [address], [place], [place] [address]  Protime-INR     Status: None   Collection Time: 07/14/21 12:00 PM  Result Value Ref Range   Prothrombin Time 14.1 11.4 - 15.2 seconds   INR 1.1  0.8 - 1.2    Comment: (NOTE) INR goal varies based on device and disease states. Performed at [facility], [address] [place], [place] [address]   APTT     Status: None   Collection Time: 07/14/21 12:00 PM  Result Value Ref Range   aPTT 30 24 - 36 seconds    Comment: Performed at [facility], [address] [place], [place] [address]  Resp Panel by RT-PCR (Flu A&B, Covid) Peripheral     Status: None   Collection Time: 07/14/21 12:02 PM   Specimen: Peripheral; Nasopharyngeal(NP) swabs in vial transport medium  Result Value Ref Range   SARS Coronavirus 2 by RT PCR NEGATIVE NEGATIVE    Comment: (NOTE) SARS-CoV-2 target nucleic acids are NOT DETECTED.  The SARS-CoV-2 RNA is generally detectable in upper respiratory specimens during the acute phase of infection. The lowest concentration of SARS-CoV-2 viral copies this assay can detect is 138 copies/mL. A negative result does not preclude SARS-Cov-2 infection and should not be used as the sole basis for treatment or other patient management decisions. A negative result may occur with  improper specimen collection/handling, submission of specimen other than nasopharyngeal swab, presence of viral mutation(s) within the areas targeted by this assay, and inadequate number of viral copies(<138 copies/mL). A negative result must be combined with clinical observations, patient history, and epidemiological information. The expected result is Negative.  Fact Sheet for Patients:  BloggerCourse.com  Fact Sheet for Healthcare Providers:  SeriousBroker.it  This test is no t yet approved or cleared by the [place] FDA and  has been authorized for detection and/or diagnosis of SARS-CoV-2 by FDA under an Emergency Use Authorization (EUA). This EUA will remain  in effect (meaning this test can be used) for the duration of the COVID-19 declaration  under Section 564(b)(1) of the Act, 21 U.S.C.section 360bbb-3(b)(1), unless the authorization is terminated  or revoked sooner.       Influenza A by PCR NEGATIVE NEGATIVE   Influenza B by PCR NEGATIVE NEGATIVE    Comment: (NOTE) The Xpert Xpress SARS-CoV-2/FLU/RSV plus assay is intended as an aid in the diagnosis of influenza from Nasopharyngeal swab specimens and should not be used as a sole basis for treatment. Nasal washings and aspirates are  unacceptable for Xpert Xpress SARS-CoV-2/FLU/RSV testing.  Fact Sheet for Patients: BloggerCourse.com  Fact Sheet for Healthcare Providers: SeriousBroker.it  This test is not yet approved or cleared by the [place] FDA and has been authorized for detection and/or diagnosis of SARS-CoV-2 by FDA under an Emergency Use Authorization (EUA). This EUA will remain in effect (meaning this test can be used) for the duration of the COVID-19 declaration under Section 564(b)(1) of the Act, 21 U.S.C. section 360bbb-3(b)(1), unless the authorization is terminated or revoked.  Performed at [facility], [address], [place], [place] [address]     DG Chest Port 1 View  Result Date: 07/14/2021 CLINICAL DATA:  Shortness of breath, body aches, chills, anorexia, question sepsis, history hypertension and asthma EXAM: PORTABLE CHEST 1 VIEW COMPARISON:  Portable exam 1137 hours compared to 02/17/2011 FINDINGS: Normal heart size, mediastinal contours, and pulmonary vascularity. Lungs clear. No pleural effusion or pneumothorax. Bones unremarkable. IMPRESSION: No acute abnormalities. Electronically Signed   By: Ulyses Southward M.D.   On: 07/14/2021 11:56   DG Hand Complete Left  Result Date: 07/14/2021 CLINICAL DATA:  Pain and swelling EXAM: LEFT HAND - COMPLETE 3+ VIEW COMPARISON:  04/22/2011 FINDINGS: No fracture or dislocation is seen. There are no focal lytic lesions. Possible cartilage  calcification is seen in the wrist. Monitoring device is partly obscuring distal portion of index finger. IMPRESSION: No radiographic abnormality is seen in the left hand. Electronically Signed   By: Ernie Avena M.D.   On: 07/14/2021 12:27    Review of Systems  Constitutional:  Positive for chills and fever. Negative for diaphoresis.  HENT:  Negative for ear discharge, ear pain, hearing loss and tinnitus.   Eyes:  Negative for photophobia and pain.  Respiratory:  Negative for cough, chest tightness and shortness of breath.   Cardiovascular:  Negative for chest pain.  Gastrointestinal:  Positive for nausea and vomiting. Negative for abdominal pain.  Genitourinary:  Negative for dysuria, flank pain, frequency and urgency.  Musculoskeletal:  Positive for arthralgias (Left hand/thumb). Negative for back pain, myalgias and neck pain.  Neurological:  Negative for dizziness and headaches.  Hematological:  Does not bruise/bleed easily.  Psychiatric/Behavioral:  The patient is not nervous/anxious.   Blood pressure 129/78, pulse (!) 56, temperature 98.9 F (37.2 C), temperature source Oral, resp. rate 16, SpO2 96 %. Physical Exam Constitutional:      General: She is not in acute distress.    Appearance: She is well-developed. She is not diaphoretic.  HENT:     Head: Normocephalic and atraumatic.  Eyes:     General: No scleral icterus.       Right eye: No discharge.        Left eye: No discharge.     Conjunctiva/sclera: Conjunctivae normal.  Cardiovascular:     Rate and Rhythm: Normal rate and regular rhythm.  Pulmonary:     Effort: Pulmonary effort is normal. No respiratory distress.  Musculoskeletal:     Cervical back: Normal range of motion.     Comments: Left shoulder, elbow, wrist, digits- no skin wounds, severe TTP base of thumb, ROM<10 degrees 1st MCP, fusiform edema thumb, erythema thumb, radial aspect of hand, proximal FA, no instability, no blocks to motion  Sens  Ax/R/M/U  intact  Mot   Ax/ R/ PIN/ M/ AIN/ U intact  Rad 2+  Skin:    General: Skin is warm and dry.  Neurological:     Mental Status: She is alert.  Psychiatric:        Mood and Affect: Mood normal.        Behavior: Behavior normal.    Assessment/Plan: Left thumb infection -- Plan for I&D by Dr. Merlyn LotKuzma. Please keep NPO. Will need medicine admit for IV abx.    Freeman CaldronMichael J. Jeffery, PA-C Orthopedic Surgery [phone] 07/14/2021, 1:58 PM   Addendum:  Patient seen and examined.  Agree with above. History: 61 year old right-hand-dominant female with swelling pain and erythema of left thumb over the past 24 hours.  This has become progressively worse.  She has had fevers.  She has had previous incision and drainage of the hand approximately 10 years ago.  She does not remember a recent injuries. Exam: Intact sensation and capillary refill in the digits of left hand.  The thumb is swollen and erythematous.  Tender to palpation.  She is exquisitely tender along the volar surface.  She is also tender at the MP and more so at the IP joints.  There is a streak into the forearm on the palmar surface.  The thenar eminence is swollen as well.  No wounds. A/P: Left thumb infection likely flexor tenosynovitis.  She also may have infection of the MP and IP joints.  She is exquisitely tender at the joints as well.  I would recommend incision and drainage of the flexor sheath infection as well as likely the MP and IP joints.  Risks, benefits and alternatives of surgery were discussed including risks of blood loss, infection, damage to nerves/vessels/tendons/ligament/bone, failure of surgery, need for additional surgery, complication with wound healing, stiffness, need for repeat irrigation and debridement.  She voiced understanding of these risks and elected to proceed.

## 2021-07-14 NOTE — Anesthesia Preprocedure Evaluation (Addendum)
Anesthesia Evaluation  Patient identified by MRN, date of birth, ID band Patient awake    Reviewed: Allergy & Precautions, NPO status , Patient's Chart, lab work & pertinent test results  Airway Mallampati: I       Dental no notable dental hx.    Pulmonary asthma ,    Pulmonary exam normal        Cardiovascular hypertension, Pt. on medications Normal cardiovascular exam     Neuro/Psych  Headaches, negative psych ROS   GI/Hepatic negative GI ROS, Neg liver ROS,   Endo/Other  negative endocrine ROS  Renal/GU negative Renal ROS  negative genitourinary   Musculoskeletal negative musculoskeletal ROS (+)   Abdominal Normal abdominal exam  (+)   Peds  Hematology negative hematology ROS (+)   Anesthesia Other Findings   Reproductive/Obstetrics                             Anesthesia Physical Anesthesia Plan  ASA: 2  Anesthesia Plan: General   Post-op Pain Management:    Induction: Intravenous  PONV Risk Score and Plan: 4 or greater and Ondansetron, Midazolam and Treatment may vary due to age or medical condition  Airway Management Planned: LMA  Additional Equipment:   Intra-op Plan:   Post-operative Plan: Extubation in OR  Informed Consent: I have reviewed the patients History and Physical, chart, labs and discussed the procedure including the risks, benefits and alternatives for the proposed anesthesia with the patient or authorized representative who has indicated his/her understanding and acceptance.     Dental advisory given  Plan Discussed with: CRNA and Anesthesiologist  Anesthesia Plan Comments:        Anesthesia Quick Evaluation

## 2021-07-14 NOTE — Progress Notes (Signed)
Pharmacy Antibiotic Note  Christina Singleton is a 61 y.o. female presented to the ED on 07/14/2021 with c/o left hand/thumb pain, swelling and redness with streak running up her left arm.  Plan is for I&D of the area. Pharmacy has been consulted to dose vancomycin for cellulitis.  Today, 07/14/2021: - scr 0.98 - wbc 14.1  Plan: - vancomycin 1000 mg q24h for est AUC 433  _____________________________________  Temp (24hrs), Avg:98.9 F (37.2 C), Min:98.9 F (37.2 C), Max:98.9 F (37.2 C)  Recent Labs  Lab 07/14/21 1105  WBC 14.1*  CREATININE 0.98  LATICACIDVEN 1.9    CrCl cannot be calculated (Unknown ideal weight.).    Allergies  Allergen Reactions   Sulfa Antibiotics Other (See Comments)    hallucinations   Zithromax [Azithromycin] Other (See Comments)    Made her feel weird     Thank you for allowing pharmacy to be a part of this patients care.  Lucia Gaskins 07/14/2021 3:47 PM

## 2021-07-14 NOTE — Assessment & Plan Note (Signed)
On oxybutynin.  Continue.

## 2021-07-14 NOTE — Anesthesia Procedure Notes (Addendum)
Procedure Name: LMA Insertion Date/Time: 07/14/2021 6:01 PM Performed by: Rosaland Lao, CRNA Pre-anesthesia Checklist: Patient identified, Emergency Drugs available, Suction available and Patient being monitored Patient Re-evaluated:Patient Re-evaluated prior to induction Oxygen Delivery Method: Circle system utilized Preoxygenation: Pre-oxygenation with 100% oxygen Induction Type: IV induction Ventilation: Mask ventilation without difficulty LMA: LMA inserted LMA Size: 4.0 Tube type: Oral Number of attempts: 1 Placement Confirmation: positive ETCO2 and breath sounds checked- equal and bilateral Tube secured with: Tape Dental Injury: Teeth and Oropharynx as per pre-operative assessment and Injury to lip  Comments: Patient Induced with 200mg  propofol and LMA insertion attempted. Patient arrestively biting down and biting lip resulting in a  profoundly bruised lip. Patient was masked ventilated with sevoflurane and LMA was able to be inserted LMA. Lip bleeding stopped however bruise is very pronounced. Cold compress applied.

## 2021-07-14 NOTE — ED Provider Notes (Signed)
[facility] DEPT Provider Note   CSN: [id number] Arrival date & time: 07/14/21  1008     History  Chief Complaint  Patient presents with   Shortness of Breath   Generalized Body Aches   Hand Pain    Christina Singleton is a 61 y.o. female.  HPI She presents for evaluation of pain and swelling in left thumb, hand and redness of her left arm since yesterday.  No known trauma.  History of similar problem on the right hand, many years ago.  She has had some fever and chills with general achiness.  She denies weakness or dizziness.    Home Medications Prior to Admission medications   Medication Sig Start Date End Date Taking? Authorizing Provider  albuterol (PROVENTIL HFA;VENTOLIN HFA) 108 (90 BASE) MCG/ACT inhaler Inhale 2 puffs into the lungs every 6 (six) hours as needed. wheezing     [provider]  amLODipine (NORVASC) 10 MG tablet Take 10 mg by mouth daily.      [provider]  buPROPion (WELLBUTRIN SR) 150 MG 12 hr tablet Take 150 mg by mouth 2 (two) times daily.      [provider]  butalbital-acetaminophen-caffeine (FIORICET) 50-325-40 MG tablet TAKE 1 TABLET BY MOUTH EVERY 6 (SIX) HOURS AS NEEDED FOR PAIN. 07/19/20 07/19/21  Domingo Pulse, MD  butalbital-acetaminophen-caffeine (FIORICET) [phone] MG tablet Take 1 tablet by mouth every 6 hours as needed for pain 03/25/21     butalbital-acetaminophen-caffeine (FIORICET, ESGIC) 50-325-40 MG tablet Take 1 tablet by mouth every 6 (six) hours as needed for headache. 11/01/17   [provider]  ciprofloxacin (CIPRO) 500 MG tablet Take 1 tablet (500 mg total) by mouth 2 (two) times daily. 05/20/18   Mesner, Corene Cornea, MD  diphenhydrAMINE (BENADRYL) 25 MG tablet Take 25 mg by mouth every 4 (four) hours as needed for itching or allergies.     [provider]  estradiol (ESTRACE) 2 MG tablet Take 1 mg by mouth daily.      [provider]  FLUoxetine (PROZAC)  40 MG capsule Take 40 mg by mouth daily.      [provider]  nitrofurantoin (MACRODANTIN) 50 MG capsule TAKE ONE CAPSULE BY MOUTH EVERY EVENING 07/19/20 07/19/21  Domingo Pulse, MD  ondansetron (ZOFRAN ODT) 4 MG disintegrating tablet 4mg  ODT q4 hours prn nausea/vomit 05/20/18   Mesner, Corene Cornea, MD  ondansetron (ZOFRAN) 8 MG tablet Take 8 mg by mouth every 8 (eight) hours as needed for nausea or vomiting.    [provider]  oxybutynin (DITROPAN XL) 15 MG 24 hr tablet Take 15 mg by mouth at bedtime.    [provider]      Allergies    Sulfa antibiotics and Zithromax [azithromycin]    Review of Systems   Review of Systems  Physical Exam Updated Vital Signs BP 114/77    Pulse 72    Temp 98.9 F (37.2 C) (Oral)    Resp 15    SpO2 100%  Physical Exam Vitals and nursing note reviewed.  Constitutional:      General: She is not in acute distress.    Appearance: She is well-developed. She is not ill-appearing or diaphoretic.  HENT:     Head: Normocephalic and atraumatic.     Right Ear: External ear normal.     Left Ear: External ear normal.  Eyes:     Conjunctiva/sclera: Conjunctivae normal.     Pupils: Pupils are equal, round,  and reactive to light.  Neck:     Trachea: Phonation normal.  Cardiovascular:     Rate and Rhythm: Normal rate and regular rhythm.     Heart sounds: Normal heart sounds.  Pulmonary:     Effort: Pulmonary effort is normal.     Breath sounds: Normal breath sounds.  Abdominal:     General: There is no distension.     Palpations: Abdomen is soft.     Tenderness: There is no abdominal tenderness.  Musculoskeletal:     Cervical back: Normal range of motion and neck supple.     Comments: Left thumb is tender, red and swollen.  No distinct skin injury, as a locust for infection.  Redness extends to the left thenar eminence, and further as a streak of the volar forearm and upper arm.  Skin:    General: Skin is warm and dry.   Neurological:     Mental Status: She is alert and oriented to person, place, and time.     Cranial Nerves: No cranial nerve deficit.     Sensory: No sensory deficit.     Motor: No abnormal muscle tone.     Coordination: Coordination normal.  Psychiatric:        Mood and Affect: Mood normal.        Behavior: Behavior normal.        Thought Content: Thought content normal.        Judgment: Judgment normal.        ED Results / Procedures / Treatments   Labs (all labs ordered are listed, but only abnormal results are displayed) Labs Reviewed  COMPREHENSIVE METABOLIC PANEL - Abnormal; Notable for the following components:      Result Value   CO2 20 (*)    Glucose, Bld 108 (*)    Total Bilirubin 1.4 (*)    All other components within normal limits  CBC WITH DIFFERENTIAL/PLATELET - Abnormal; Notable for the following components:   WBC 14.1 (*)    Platelets 127 (*)    Neutro Abs 11.2 (*)    Monocytes Absolute 1.5 (*)    All other components within normal limits  RESP PANEL BY RT-PCR (FLU A&B, COVID) ARPGX2  CULTURE, BLOOD (ROUTINE X 2)  CULTURE, BLOOD (ROUTINE X 2)  LACTIC ACID, PLASMA  PROTIME-INR  APTT  LACTIC ACID, PLASMA  URINALYSIS, ROUTINE W REFLEX MICROSCOPIC    EKG None  Radiology DG Chest Port 1 View  Result Date: 07/14/2021 CLINICAL DATA:  Shortness of breath, body aches, chills, anorexia, question sepsis, history hypertension and asthma EXAM: PORTABLE CHEST 1 VIEW COMPARISON:  Portable exam 1137 hours compared to 02/17/2011 FINDINGS: Normal heart size, mediastinal contours, and pulmonary vascularity. Lungs clear. No pleural effusion or pneumothorax. Bones unremarkable. IMPRESSION: No acute abnormalities. Electronically Signed   By: Lavonia Dana M.D.   On: 07/14/2021 11:56   DG Hand Complete Left  Result Date: 07/14/2021 CLINICAL DATA:  Pain and swelling EXAM: LEFT HAND - COMPLETE 3+ VIEW COMPARISON:  04/22/2011 FINDINGS: No fracture or dislocation is seen.  There are no focal lytic lesions. Possible cartilage calcification is seen in the wrist. Monitoring device is partly obscuring distal portion of index finger. IMPRESSION: No radiographic abnormality is seen in the left hand. Electronically Signed   By: Elmer Picker M.D.   On: 07/14/2021 12:27    Procedures Procedures    Medications Ordered in ED Medications  lactated ringers infusion (has no administration in time range)  lactated  ringers bolus 1,000 mL (1,000 mLs Intravenous New Bag/Given 07/14/21 1202)    And  lactated ringers bolus 1,000 mL (has no administration in time range)  fentaNYL (SUBLIMAZE) injection 100 mcg (100 mcg Intravenous Given 07/14/21 1203)  ceFAZolin (ANCEF) IVPB 2g/100 mL premix (2 g Intravenous New Bag/Given 07/14/21 1202)  ondansetron (ZOFRAN) injection 4 mg (4 mg Intravenous Given 07/14/21 1203)    ED Course/ Medical Decision Making/ A&P                           Medical Decision Making She is presenting with signs and symptoms of left hand infection, spreading to left axilla as lymphangitis.  No clear sign for cause, and problems progressively worse.  History of similar problem with right hand infection, about 10 years ago.  Problems Addressed: Cellulitis of finger of left hand: acute illness or injury with systemic symptoms    Details: Progressively worse Lymphangitis: acute illness or injury    Details: Spreading to left axilla  Amount and/or Complexity of Data Reviewed Independent Historian:     Details: She is a cogent historian Labs: ordered.    Details: CBC, c-Met, lactate, blood cultures-negative except white count high, hemoglobin low, glucose slightly elevated, total bilirubin high Radiology: ordered and independent interpretation performed.    Details: Chest x-ray, left hand-no acute abnormalities ECG/medicine tests: ordered. Discussion of management or test interpretation with external provider(s): Consultation with hand surgery-I plan on  operative management, and request admission by hospitalist service for antibiotic treatment.  Consultation hospitalist to admit.  Risk Prescription drug management. Decision regarding hospitalization. Minor surgery with no identified risk factors. Risk Details: Patient with signs and symptoms of streptococcal infection left arm, treated with Ancef in the ED empirically.  Left thumb may require surgical intervention for open drainage, and culture.  She will likely need to be hospitalized for further care and treatment.  Consultation with orthopedic hand surgeon, to assist with care and management.  Critical Care Total time providing critical care: 30-74 minutes          Final Clinical Impression(s) / ED Diagnoses Final diagnoses:  Cellulitis of finger of left hand  Lymphangitis    Rx / DC Orders ED Discharge Orders     None         Daleen Bo, MD 07/14/21 1514

## 2021-07-14 NOTE — Progress Notes (Signed)
Arrived from PACU . Alert and oriented. Denies nausea, dizziness, shortness of breath. Placed on continuous pulse oximetry. Sats 97% RA and HR 67. Pain 6/10 but states that Norco makes her "sick." Notified X.Blount. LR infusing at 150cc/hr. 1 Assist up to bathroom . Gait steady. Left hand dressing C/D/I. Sensation intact . Family at bedside.

## 2021-07-15 ENCOUNTER — Encounter (HOSPITAL_COMMUNITY): Payer: Self-pay | Admitting: Orthopedic Surgery

## 2021-07-15 DIAGNOSIS — G43909 Migraine, unspecified, not intractable, without status migrainosus: Secondary | ICD-10-CM | POA: Diagnosis present

## 2021-07-15 DIAGNOSIS — N3010 Interstitial cystitis (chronic) without hematuria: Secondary | ICD-10-CM | POA: Diagnosis present

## 2021-07-15 DIAGNOSIS — E7800 Pure hypercholesterolemia, unspecified: Secondary | ICD-10-CM | POA: Diagnosis present

## 2021-07-15 DIAGNOSIS — F39 Unspecified mood [affective] disorder: Secondary | ICD-10-CM | POA: Diagnosis present

## 2021-07-15 DIAGNOSIS — Z881 Allergy status to other antibiotic agents status: Secondary | ICD-10-CM | POA: Diagnosis not present

## 2021-07-15 DIAGNOSIS — L02512 Cutaneous abscess of left hand: Secondary | ICD-10-CM | POA: Diagnosis present

## 2021-07-15 DIAGNOSIS — Z882 Allergy status to sulfonamides status: Secondary | ICD-10-CM | POA: Diagnosis not present

## 2021-07-15 DIAGNOSIS — N3281 Overactive bladder: Secondary | ICD-10-CM | POA: Diagnosis present

## 2021-07-15 DIAGNOSIS — M7989 Other specified soft tissue disorders: Secondary | ICD-10-CM | POA: Diagnosis present

## 2021-07-15 DIAGNOSIS — Z79890 Hormone replacement therapy (postmenopausal): Secondary | ICD-10-CM | POA: Diagnosis not present

## 2021-07-15 DIAGNOSIS — L03012 Cellulitis of left finger: Secondary | ICD-10-CM | POA: Diagnosis present

## 2021-07-15 DIAGNOSIS — D696 Thrombocytopenia, unspecified: Secondary | ICD-10-CM | POA: Diagnosis present

## 2021-07-15 DIAGNOSIS — I10 Essential (primary) hypertension: Secondary | ICD-10-CM | POA: Diagnosis present

## 2021-07-15 DIAGNOSIS — Z90710 Acquired absence of both cervix and uterus: Secondary | ICD-10-CM | POA: Diagnosis not present

## 2021-07-15 DIAGNOSIS — Z20822 Contact with and (suspected) exposure to covid-19: Secondary | ICD-10-CM | POA: Diagnosis present

## 2021-07-15 DIAGNOSIS — Z79899 Other long term (current) drug therapy: Secondary | ICD-10-CM | POA: Diagnosis not present

## 2021-07-15 DIAGNOSIS — Z8619 Personal history of other infectious and parasitic diseases: Secondary | ICD-10-CM | POA: Diagnosis not present

## 2021-07-15 DIAGNOSIS — L089 Local infection of the skin and subcutaneous tissue, unspecified: Secondary | ICD-10-CM | POA: Diagnosis present

## 2021-07-15 DIAGNOSIS — K589 Irritable bowel syndrome without diarrhea: Secondary | ICD-10-CM | POA: Diagnosis present

## 2021-07-15 DIAGNOSIS — Q225 Ebstein's anomaly: Secondary | ICD-10-CM | POA: Diagnosis not present

## 2021-07-15 DIAGNOSIS — B954 Other streptococcus as the cause of diseases classified elsewhere: Secondary | ICD-10-CM | POA: Diagnosis present

## 2021-07-15 DIAGNOSIS — J45909 Unspecified asthma, uncomplicated: Secondary | ICD-10-CM | POA: Diagnosis present

## 2021-07-15 LAB — CBC
HCT: 37.8 % (ref 36.0–46.0)
Hemoglobin: 12.2 g/dL (ref 12.0–15.0)
MCH: 29.2 pg (ref 26.0–34.0)
MCHC: 32.3 g/dL (ref 30.0–36.0)
MCV: 90.4 fL (ref 80.0–100.0)
Platelets: 116 10*3/uL — ABNORMAL LOW (ref 150–400)
RBC: 4.18 MIL/uL (ref 3.87–5.11)
RDW: 14 % (ref 11.5–15.5)
WBC: 10.7 10*3/uL — ABNORMAL HIGH (ref 4.0–10.5)
nRBC: 0 % (ref 0.0–0.2)

## 2021-07-15 LAB — COMPREHENSIVE METABOLIC PANEL
ALT: 12 U/L (ref 0–44)
AST: 17 U/L (ref 15–41)
Albumin: 3.1 g/dL — ABNORMAL LOW (ref 3.5–5.0)
Alkaline Phosphatase: 61 U/L (ref 38–126)
Anion gap: 6 (ref 5–15)
BUN: 9 mg/dL (ref 8–23)
CO2: 26 mmol/L (ref 22–32)
Calcium: 8.4 mg/dL — ABNORMAL LOW (ref 8.9–10.3)
Chloride: 106 mmol/L (ref 98–111)
Creatinine, Ser: 0.87 mg/dL (ref 0.44–1.00)
GFR, Estimated: >60 mL/min (ref >60)
Glucose, Bld: 147 mg/dL — ABNORMAL HIGH (ref 70–99)
Potassium: 3.5 mmol/L (ref 3.5–5.1)
Sodium: 138 mmol/L (ref 135–145)
Total Bilirubin: 0.2 mg/dL — ABNORMAL LOW (ref 0.3–1.2)
Total Protein: 5.9 g/dL — ABNORMAL LOW (ref 6.5–8.1)

## 2021-07-15 LAB — HIV ANTIBODY (ROUTINE TESTING W REFLEX): HIV Screen 4th Generation wRfx: NONREACTIVE

## 2021-07-15 MED ORDER — TRAMADOL HCL 50 MG PO TABS
50.0000 mg | ORAL_TABLET | Freq: Once | ORAL | Status: AC
  Administered 2021-07-15: 50 mg via ORAL
  Filled 2021-07-15: qty 1

## 2021-07-15 MED ORDER — SODIUM CHLORIDE 0.9 % IV SOLN
INTRAVENOUS | Status: DC | PRN
Start: 2021-07-15 — End: 2021-07-17

## 2021-07-15 MED ORDER — ACETAMINOPHEN 500 MG PO TABS
1000.0000 mg | ORAL_TABLET | Freq: Three times a day (TID) | ORAL | Status: AC
Start: 2021-07-15 — End: 2021-07-16
  Administered 2021-07-15 – 2021-07-16 (×6): 1000 mg via ORAL
  Filled 2021-07-15 (×6): qty 2

## 2021-07-15 MED ORDER — SODIUM CHLORIDE 0.9 % IV SOLN: INTRAVENOUS | Status: DC | PRN

## 2021-07-15 MED ORDER — MORPHINE SULFATE (PF) 2 MG/ML IV SOLN: 2.0000 mg | INTRAVENOUS | Status: DC | PRN

## 2021-07-15 MED ORDER — TRAMADOL HCL 50 MG PO TABS
50.0000 mg | ORAL_TABLET | Freq: Four times a day (QID) | ORAL | Status: DC | PRN
  Administered 2021-07-15 – 2021-07-17 (×7): 50 mg via ORAL
  Filled 2021-07-15 (×7): qty 1

## 2021-07-15 NOTE — Plan of Care (Signed)
  Problem: Education: Goal: Knowledge of General Education information will improve Description: Including pain rating scale, medication(s)/side effects and non-pharmacologic comfort measures Outcome: Progressing   Problem: Pain Managment: Goal: General experience of comfort will improve Outcome: Progressing   Problem: Clinical Measurements: Goal: Ability to avoid or minimize complications of infection will improve Outcome: Progressing   

## 2021-07-15 NOTE — Hospital Course (Signed)
Christina Singleton is a 61 y.o. female with medical history significant of mood disorder, HTN, HLD, staph infection of left upper extremity for injury in 2012. Patient presents with complaints of spontaneous swelling of left thumb started this morning associated with fever and chills as well as body ache. Underwent incision and drainage by hand surgery on 2/23.

## 2021-07-15 NOTE — Progress Notes (Signed)
°  Progress Note   Patient: Christina Singleton:[id number] DOB: May 05, 1961 DOA: 07/14/2021     Hospitalization day: 0 DOS: the patient was seen and examined on 07/15/2021   Brief hospital course:  Christina Singleton is a 61 y.o. female with medical history significant of mood disorder, HTN, HLD, staph infection of left upper extremity for injury in 2012. Patient presents with complaints of spontaneous swelling of left thumb started this morning associated with fever and chills as well as body ache. Underwent incision and drainage by hand surgery on 2/23.  Assessment and Plan: * Abscess of left thumb- (present on admission) Presents with spontaneous swelling of left temple and hand without any injury or trauma. Hand surgery consulted, underwent IND on 2/24. Patient had similar infection in the past requiring I&D x2 where cultures grew staph. Continue IV vancomycin.  We will stop fluids. Follow-up on blood cultures.  Thrombocytopenia (HCC)- (present on admission) Patient reports easy bruising. Platelet count at baseline normal.  On presentation platelet count 140.  Currently dropping down to 110. Patient had a bruising episode with intubation. We will monitor for now.  Overactive bladder- (present on admission) On oxybutynin.  Continue.  Mood disorder (HCC)- (present on admission) On Wellbutrin.  Continue.  Essential hypertension- (present on admission) Blood pressure relatively stable. Currently holding blood pressure medications.  Headache- (present on admission) Continue Fioricet.  No headache right now.        Subjective: No nausea no vomiting.  Still has pain.  No fever no chills.  No diarrhea.  Physical Exam: Vitals:   07/15/21 0530 07/15/21 0944 07/15/21 1520 07/15/21 1522  BP: 128/89 (!) 147/80 (!) 158/91 (!) 161/94  Pulse: 60 (!) 52 (!) 57 (!) 55  Resp: 18 15 15    Temp: 98.2 F (36.8 C) 98.2 F (36.8 C) 98.3 F (36.8 C)   TempSrc: Oral Oral Oral   SpO2: 97%  95% 99% 97%  Weight:      Height:       General: Appear in mild distress; no visible Abnormal Neck Mass Or lumps, Conjunctiva normal Cardiovascular: S1 and S2 Present, no Murmur, Respiratory: good respiratory effort, Bilateral Air entry present and CTA, no Crackles, no wheezes Abdomen: Bowel Sound present Extremities: no Pedal edema Neurology: alert and oriented to time, place, and person Gait not checked due to patient safety concerns   Data Reviewed:  I have Reviewed nursing notes, Vitals, and Lab results since pt's last encounter. Pertinent lab results CBC BMP I have ordered test including CBC I have reviewed the last note from orthopedic surgery,    Family Communication: At bedside all questions answered.  Disposition: Status is: Inpatient Remains inpatient appropriate because: Still needing IV antibiotic.  Wound culture pending.  Author: , MD 07/15/2021 6:13 PM  For on call review [email or web address].

## 2021-07-15 NOTE — Assessment & Plan Note (Addendum)
Resolved. Patient reports easy bruising. Platelet count at baseline normal.  Now back to normal after dropping down to 110. Patient had a bruising episode with intubation.

## 2021-07-15 NOTE — Progress Notes (Signed)
Transition of Care [facility]) Screening Note  Patient Details  Name: Christina Singleton Date of Birth: 10-Jun-1960  Transition of Care [facility]) CM/SW Contact:    Sherie Don, LCSW Phone Number: 07/15/2021, 9:51 AM  Transition of Care Department [facility]) has reviewed patient and no TOC needs have been identified at this time. We will continue to monitor patient advancement through interdisciplinary progression rounds. If new patient transition needs arise, please place a TOC consult.

## 2021-07-15 NOTE — Plan of Care (Signed)
  Problem: Education: Goal: Knowledge of General Education information will improve Description Including pain rating scale, medication(s)/side effects and non-pharmacologic comfort measures Outcome: Progressing   

## 2021-07-16 DIAGNOSIS — L02512 Cutaneous abscess of left hand: Secondary | ICD-10-CM | POA: Diagnosis not present

## 2021-07-16 LAB — CBC
HCT: 41.2 % (ref 36.0–46.0)
Hemoglobin: 13.2 g/dL (ref 12.0–15.0)
MCH: 29 pg (ref 26.0–34.0)
MCHC: 32 g/dL (ref 30.0–36.0)
MCV: 90.5 fL (ref 80.0–100.0)
Platelets: 150 10*3/uL (ref 150–400)
RBC: 4.55 MIL/uL (ref 3.87–5.11)
RDW: 14.1 % (ref 11.5–15.5)
WBC: 10.7 10*3/uL — ABNORMAL HIGH (ref 4.0–10.5)
nRBC: 0 % (ref 0.0–0.2)

## 2021-07-16 MED ORDER — CYCLOBENZAPRINE HCL 5 MG PO TABS
5.0000 mg | ORAL_TABLET | Freq: Three times a day (TID) | ORAL | Status: DC | PRN
  Administered 2021-07-16 – 2021-07-17 (×2): 5 mg via ORAL
  Filled 2021-07-16 (×2): qty 1

## 2021-07-16 NOTE — Plan of Care (Signed)
°  Problem: Education: Goal: Knowledge of General Education information will improve Description: Including pain rating scale, medication(s)/side effects and non-pharmacologic comfort measures Outcome: Progressing   Problem: Clinical Measurements: Goal: Ability to maintain clinical measurements within normal limits will improve Outcome: Progressing   Problem: Coping: Goal: Level of anxiety will decrease Outcome: Progressing   Problem: Pain Managment: Goal: General experience of comfort will improve Outcome: Progressing   Problem: Clinical Measurements: Goal: Ability to avoid or minimize complications of infection will improve Outcome: Progressing

## 2021-07-16 NOTE — Progress Notes (Signed)
°  Progress Note   Patient: Christina Singleton AYT:[id number] DOB: [birth year] DOA: 07/14/2021     Hospitalization day: 1 DOS: the patient was seen and examined on 07/16/2021   Brief hospital course:  DARIANE NATZKE is a 61 y.o. female with medical history significant of mood disorder, HTN, HLD, staph infection of left upper extremity for injury in 2012. Patient presents with complaints of spontaneous swelling of left thumb started this morning associated with fever and chills as well as body ache. Underwent incision and drainage by hand surgery on 2/23.  Assessment and Plan: * Abscess of left thumb- (present on admission) Pyrogens.Presents with spontaneous swelling of left temple and hand without any injury or trauma. Hand surgery consulted, underwent IND on 2/24. Patient had similar infection in the past requiring I&D x2 where cultures grew staph. Blood cultures so far negative. Wound culture growing strep pyogenes. Currently on IV vancomycin.  Will discuss with ID tomorrow.  Thrombocytopenia (HCC)- (present on admission) Resolved. Patient reports easy bruising. Platelet count at baseline normal.  Now back to normal after dropping down to 110. Patient had a bruising episode with intubation.  Overactive bladder- (present on admission) On oxybutynin.  Continue.  Mood disorder (HCC)- (present on admission) On Wellbutrin.  Continue.  Essential hypertension- (present on admission) Blood pressure relatively stable. Currently holding blood pressure medications.  Headache- (present on admission) Continue Fioricet.  No headache right now.   Subjective: No nausea no vomiting no fever no chills.  No chest pain abdominal pain.  Reports muscle pain as well as stiffness in elbow joint and shoulder joint.  Physical Exam: Vitals:   07/15/21 1520 07/15/21 1522 07/15/21 2134 07/16/21 0510  BP: (!) 158/91 (!) 161/94 (!) 144/88 129/83  Pulse: (!) 57 (!) 55 60 60  Resp: 15  16 18   Temp: 98.3  F (36.8 C)  98.4 F (36.9 C) 99 F (37.2 C)  TempSrc: Oral  Oral Oral  SpO2: 99% 97% 99% 97%  Weight:      Height:       General: Appear in mild distress; no visible Abnormal Neck Mass Or lumps, Conjunctiva normal Cardiovascular: S1 and S2 Present, no Murmur, Respiratory: good respiratory effort, Bilateral Air entry present and CTA, no Crackles, no wheezes Abdomen: Bowel Sound present Extremities: no Pedal edema, decreased ROM elbow and shoulder on the left due to stiffness and pain Neurology: alert and oriented to time, place, and person Gait not checked due to patient safety concerns   Data Reviewed:  I have Reviewed nursing notes, Vitals, and Lab results since pt's last encounter. Pertinent lab results CBC I have ordered test including CBC and BMP    Family Communication: Family at bedside.  Questions answered.  Disposition: Status is: Inpatient Remains inpatient appropriate because: Need for IV antibiotics, further clarification on the cultures.  Author: , MD 07/16/2021 7:59 PM  For on call review [email or web address].

## 2021-07-16 NOTE — Plan of Care (Signed)
°  Problem: Clinical Measurements: Goal: Respiratory complications will improve Outcome: Progressing   Problem: Clinical Measurements: Goal: Diagnostic test results will improve Outcome: Progressing   Problem: Activity: Goal: Risk for activity intolerance will decrease Outcome: Progressing   Problem: Nutrition: Goal: Adequate nutrition will be maintained Outcome: Progressing

## 2021-07-17 LAB — CBC
HCT: 39.3 % (ref 36.0–46.0)
Hemoglobin: 12.7 g/dL (ref 12.0–15.0)
MCH: 29.3 pg (ref 26.0–34.0)
MCHC: 32.3 g/dL (ref 30.0–36.0)
MCV: 90.8 fL (ref 80.0–100.0)
Platelets: 140 10*3/uL — ABNORMAL LOW (ref 150–400)
RBC: 4.33 MIL/uL (ref 3.87–5.11)
RDW: 13.8 % (ref 11.5–15.5)
WBC: 7.8 10*3/uL (ref 4.0–10.5)
nRBC: 0 % (ref 0.0–0.2)

## 2021-07-17 MED ORDER — CYCLOBENZAPRINE HCL 5 MG PO TABS: 5.0000 mg | ORAL_TABLET | Freq: Three times a day (TID) | ORAL | 0 refills | Status: AC | PRN

## 2021-07-17 MED ORDER — AMOXICILLIN-POT CLAVULANATE 875-125 MG PO TABS: 1.0000 | ORAL_TABLET | Freq: Two times a day (BID) | ORAL | 0 refills | Status: AC

## 2021-07-17 MED ORDER — FLUOXETINE HCL 40 MG PO CAPS: 40.0000 mg | ORAL_CAPSULE | Freq: Every day | ORAL | 3 refills | Status: AC

## 2021-07-17 MED ORDER — TRAMADOL HCL 50 MG PO TABS: 50.0000 mg | ORAL_TABLET | Freq: Four times a day (QID) | ORAL | 0 refills | Status: AC | PRN

## 2021-07-17 MED ORDER — ASCORBIC ACID 1000 MG PO TABS: 1000.0000 mg | ORAL_TABLET | Freq: Every day | ORAL | 0 refills | Status: AC

## 2021-07-17 MED ORDER — AMOXICILLIN-POT CLAVULANATE 875-125 MG PO TABS
1.0000 | ORAL_TABLET | Freq: Two times a day (BID) | ORAL | Status: DC
  Administered 2021-07-17: 1 via ORAL
  Filled 2021-07-17: qty 1

## 2021-07-17 MED ORDER — DOCUSATE SODIUM 100 MG PO CAPS: 100.0000 mg | ORAL_CAPSULE | Freq: Two times a day (BID) | ORAL | 0 refills | Status: AC

## 2021-07-17 NOTE — Plan of Care (Signed)
  Problem: Education: Goal: Knowledge of General Education information will improve Description: Including pain rating scale, medication(s)/side effects and non-pharmacologic comfort measures Outcome: Progressing   Problem: Clinical Measurements: Goal: Ability to maintain clinical measurements within normal limits will improve Outcome: Progressing   Problem: Activity: Goal: Risk for activity intolerance will decrease Outcome: Progressing   Problem: Nutrition: Goal: Adequate nutrition will be maintained Outcome: Progressing   Problem: Elimination: Goal: Will not experience complications related to bowel motility Outcome: Progressing   Problem: Pain Managment: Goal: General experience of comfort will improve Outcome: Progressing   Problem: Safety: Goal: Ability to remain free from injury will improve Outcome: Progressing   Problem: Skin Integrity: Goal: Risk for impaired skin integrity will decrease Outcome: Progressing   

## 2021-07-17 NOTE — Evaluation (Signed)
Occupational Therapy Evaluation Patient Details Name: Christina Singleton MRN: [id number] DOB: 02/29/1961 Today's Date: 07/17/2021   History of Present Illness Christina Singleton is a 61 y.o. female with medical history significant of mood disorder, HTN, HLD, staph infection of left upper extremity for injury in 2012.  Patient presents with complaints of spontaneous swelling of left thumb started this morning associated with fever and chills as well as body ache.  Underwent incision and drainage by hand surgery on 2/23.   Clinical Impression   Christina Singleton is a 61 year old woman s/p I & D of left thumb and flexor sheath. She presents today in thumb spica splint and ace bandage. Thumb is fully immobilized by splint. Patient had edema in digits 2-5. Therapist educated on need for ice, elevation and gentle active and PROM as needed to maintain ROM. No further OT needs. Recommend f/u with hand surgeon to further POC and potential need for OP hand therapy.     Recommendations for follow up therapy are one component of a multi-disciplinary discharge planning process, led by the attending physician.  Recommendations may be updated based on patient status, additional functional criteria and insurance authorization.   Follow Up Recommendations  Follow physician's recommendations for discharge plan and follow up therapies    Assistance Recommended at Discharge None  Patient can return home with the following Assistance with cooking/housework    Functional Status Assessment  Patient has had a recent decline in their functional status and demonstrates the ability to make significant improvements in function in a reasonable and predictable amount of time.  Equipment Recommendations  None recommended by OT    Recommendations for Other Services       Precautions / Restrictions Restrictions Weight Bearing Restrictions: Yes LUE Weight Bearing: Non weight bearing      Mobility Bed  Mobility Overal bed mobility: Independent                  Transfers Overall transfer level: Independent                        Balance Overall balance assessment: Independent                                         ADL either performed or assessed with clinical judgement   ADL Overall ADL's : Modified independent                                       General ADL Comments: Independent with ADLs with use of one hand.     Vision Patient Visual Report: No change from baseline       Perception     Praxis      Pertinent Vitals/Pain Pain Assessment Pain Assessment: No/denies pain     Hand Dominance     Extremity/Trunk Assessment Upper Extremity Assessment Upper Extremity Assessment: LUE deficits/detail LUE Deficits / Details: Patient in thumb spica splint and ace bandage, swelling in digits 2-5 and dorsum of hand that could be seen. Patient's fingers maintained in partial flexed position. She able to flex fingers partially with discomfort but not in to full composite fist. She is lacking full finger extension but has more pain with extension.   Lower Extremity Assessment Lower Extremity Assessment:  Overall [facility] for tasks assessed   Cervical / Trunk Assessment Cervical / Trunk Assessment: Normal   Communication     Cognition Arousal/Alertness: Awake/alert Behavior During Therapy: WFL for tasks assessed/performed Overall Cognitive Status: Within Functional Limits for tasks assessed                                       General Comments       Exercises     Shoulder Instructions      Home Living Family/patient expects to be discharged to:: Private residence Living Arrangements: Alone Available Help at Discharge: Available PRN/intermittently;Friend(s)                                    Prior Functioning/Environment                          OT Problem List: Decreased  strength;Pain;Decreased range of motion;Decreased coordination      OT Treatment/Interventions:      OT Goals(Current goals can be found in the care plan section) Acute Rehab OT Goals OT Goal Formulation: All assessment and education complete, DC therapy  OT Frequency:      Co-evaluation              AM-PAC OT "6 Clicks" Daily Activity     Outcome Measure Help from another person eating meals?: None Help from another person taking care of personal grooming?: None Help from another person toileting, which includes using toliet, bedpan, or urinal?: None Help from another person bathing (including washing, rinsing, drying)?: None Help from another person to put on and taking off regular upper body clothing?: None Help from another person to put on and taking off regular lower body clothing?: None 6 Click Score: 24   End of Session Nurse Communication:  (Okay for discharge)  Activity Tolerance: Patient tolerated treatment well Patient left: in bed;with family/visitor present  OT Visit Diagnosis: Pain                Time: 1203-1211 OT Time Calculation (min): 8 min Charges:  OT General Charges $OT Visit: 1 Visit OT Evaluation $OT Eval Low Complexity: 1 Low  Zeferino Mounts, OTR/L Acute Care Rehab Services  Office [phone] Pager: [phone]   Kelli Churn 07/17/2021, 1:09 PM

## 2021-07-17 NOTE — Progress Notes (Signed)
Provided discharge education/instructions. Pt not in acute distress, discharged home with belongings.

## 2021-07-19 LAB — CULTURE, BLOOD (ROUTINE X 2)
Culture: NO GROWTH
Culture: NO GROWTH

## 2021-07-20 LAB — AEROBIC/ANAEROBIC CULTURE W GRAM STAIN (SURGICAL/DEEP WOUND): Gram Stain: NONE SEEN

## 2021-07-20 NOTE — Discharge Summary (Addendum)
Physician Discharge Summary   Patient: Christina Singleton MRN: [id number] DOB: 1961-04-08  Admit date:     07/14/2021  Discharge date: 07/17/2021  Discharge Physician: Lynden Oxford   PCP: Mila Palmer, MD   Recommendations at discharge:   Follow-up with hand surgery in 1 day.  Discharge Diagnoses: Principal Problem:   Abscess of left thumb Active Problems:   Headache   Essential hypertension   Mood disorder (HCC)   Overactive bladder   Thrombocytopenia (HCC)   Wound infection  Resolved Problems:   * No resolved hospital problems. *   Hospital Course:  Christina Singleton is a 61 y.o. female with medical history significant of mood disorder, HTN, HLD, staph infection of left upper extremity for injury in 2012. Patient presents with complaints of spontaneous swelling of left thumb started this morning associated with fever and chills as well as body ache. Underwent incision and drainage by hand surgery on 2/23.  Assessment and Plan: * Abscess of left thumb- (present on admission) Pyrogens.Presents with spontaneous swelling of left temple and hand without any injury or trauma. Hand surgery consulted, underwent IND on 2/24. Patient had similar infection in the past requiring I&D x2 where cultures grew staph. Blood cultures so far negative. Wound culture growing strep pyogenes. Patient was treated with IV vancomycin throughout the hospitalization. On discharge antibiotics were changed to oral Augmentin. In the past patient had a staph that was resistant to penicillin, tetracycline and sensitive to clindamycin and Bactrim. Discussed with hand surgery.  Patient will follow-up with them in the clinic for hydrotherapy.  Thrombocytopenia (HCC)- (present on admission) Resolved. Patient reports easy bruising. Platelet count at baseline normal.  Now back to normal after dropping down to 110. Patient had a bruising episode with intubation.  Overactive bladder- (present on  admission) On oxybutynin.  Continue.  Mood disorder (HCC)- (present on admission) On Wellbutrin.  Continue.  Essential hypertension- (present on admission) Blood pressure relatively stable without any medication.  Continue to hold them on discharge.  Follow-up with PCP.  Headache- (present on admission) Continue Fioricet.  No headache right now.  Pain control - [employer] Controlled Substance Reporting System database was reviewed. and patient was instructed, not to drive, operate heavy machinery, perform activities at heights, swimming or participation in water activities or provide baby-sitting services while on Pain, Sleep and Anxiety Medications; until their outpatient Physician has advised to do so again. Also recommended to not to take more than prescribed Pain, Sleep and Anxiety Medications.   Consultants: Hand surgery Procedures performed: Incision and drainage Disposition: Home Diet recommendation:  Discharge Diet Orders (From admission, onward)     Start     Ordered   07/17/21 0000  Diet - low sodium heart healthy        07/17/21 1131           Regular diet  DISCHARGE MEDICATION: Allergies as of 07/17/2021       Reactions   Sulfa Antibiotics Other (See Comments)   hallucinations   Zithromax [azithromycin] Other (See Comments)   Made her feel weird        Medication List     STOP taking these medications    amLODipine-valsartan 10-160 MG tablet Commonly known as: EXFORGE   Bac 50-325-40 MG tablet Generic drug: butalbital-acetaminophen-caffeine   butalbital-acetaminophen-caffeine 50-325-40 MG tablet Commonly known as: FIORICET   diphenhydrAMINE 25 MG tablet Commonly known as: BENADRYL   nitrofurantoin 50 MG capsule Commonly known as: MACRODANTIN  TAKE these medications    albuterol 108 (90 Base) MCG/ACT inhaler Commonly known as: VENTOLIN HFA Inhale 2 puffs into the lungs every 6 (six) hours as needed for wheezing or shortness of  breath. wheezing Notes to patient: Resume home regimen   amoxicillin-clavulanate 875-125 MG tablet Commonly known as: AUGMENTIN Take 1 tablet by mouth 2 (two) times daily for 5 days. Notes to patient: 02/26 bedtime   ascorbic acid 1000 MG tablet Commonly known as: VITAMIN C Take 1 tablet (1,000 mg total) by mouth daily.   buPROPion 300 MG 24 hr tablet Commonly known as: WELLBUTRIN XL Take 300 mg by mouth every morning.   cyclobenzaprine 5 MG tablet Commonly known as: FLEXERIL Take 1 tablet (5 mg total) by mouth 3 (three) times daily as needed for muscle spasms. Notes to patient: Last dose given 02/26 08:54am   docusate sodium 100 MG capsule Commonly known as: Colace Take 1 capsule (100 mg total) by mouth 2 (two) times daily.   estradiol 2 MG tablet Commonly known as: ESTRACE Take 2 mg by mouth daily. Notes to patient: Resume home regimen   FLUoxetine 40 MG capsule Commonly known as: PROZAC Take 1 capsule (40 mg total) by mouth daily. Hold for a week while taking pain medication. What changed: additional instructions   ondansetron 4 MG disintegrating tablet Commonly known as: Zofran ODT 4mg  ODT q4 hours prn nausea/vomit What changed:  how much to take when to take this reasons to take this additional instructions Notes to patient: Last dose given 02/23 07:38pm   oxybutynin 15 MG 24 hr tablet Commonly known as: DITROPAN XL Take 15 mg by mouth at bedtime.   traMADol 50 MG tablet Commonly known as: ULTRAM Take 1 tablet (50 mg total) by mouth every 6 (six) hours as needed for moderate pain or severe pain. Notes to patient: Last dose given 02/26 08:54am               Discharge Care Instructions  (From admission, onward)           Start     Ordered   07/17/21 0000  Leave dressing on - Keep it clean, dry, and intact until clinic visit        07/17/21 1131            Follow-up Information     07/19/21, MD. Schedule an appointment as soon  as possible for a visit in 2 week(s).   Specialty: Family Medicine Contact information: [address] [place] [phone]         [phone], MD. Call in 1 day(s).   Specialty: Orthopedic Surgery Why: Call Office at 8:30 AM tomorrow to set up and appointment and hydrotherapy for wound. Contact information: [address] [place] [phone]                 Discharge Exam: [phone] Weights   07/14/21 1553 07/14/21 2000  Weight: 61.2 kg 65.4 kg   General: Appear in mild distress, no Rash; Oral Mucosa Clear, moist. no Abnormal Neck Mass Or lumps, Conjunctiva normal  Cardiovascular: S1 and S2 Present, no Murmur, Respiratory: good respiratory effort, Bilateral Air entry present and CTA, no Crackles, no wheezes Abdomen: Bowel Sound present, Soft and no tenderness Extremities: no Pedal edema, left upper extremity wrapped in splint Neurology: alert and oriented to time, place, and person affect appropriate. no new focal deficit Gait not checked due to patient safety concerns  Condition at discharge: good  The results of significant diagnostics from this hospitalization (including imaging, microbiology, ancillary and laboratory) are listed below for reference.   Imaging Studies: DG Chest Port 1 View  Result Date: 07/14/2021 CLINICAL DATA:  Shortness of breath, body aches, chills, anorexia, question sepsis, history hypertension and asthma EXAM: PORTABLE CHEST 1 VIEW COMPARISON:  Portable exam 1137 hours compared to 02/17/2011 FINDINGS: Normal heart size, mediastinal contours, and pulmonary vascularity. Lungs clear. No pleural effusion or pneumothorax. Bones unremarkable. IMPRESSION: No acute abnormalities. Electronically Signed   By: Ulyses SouthwardMark  Boles M.D.   On: 07/14/2021 11:56   DG Hand Complete Left  Result Date: 07/14/2021 CLINICAL DATA:  Pain and swelling EXAM: LEFT HAND - COMPLETE 3+ VIEW COMPARISON:  04/22/2011 FINDINGS:  No fracture or dislocation is seen. There are no focal lytic lesions. Possible cartilage calcification is seen in the wrist. Monitoring device is partly obscuring distal portion of index finger. IMPRESSION: No radiographic abnormality is seen in the left hand. Electronically Signed   By: Ernie AvenaPalani  Rathinasamy M.D.   On: 07/14/2021 12:27    Microbiology: Results for orders placed or performed during the hospital encounter of 07/14/21  Blood Culture (routine x 2)     Status: None   Collection Time: 07/14/21 11:05 AM   Specimen: BLOOD RIGHT HAND  Result Value Ref Range Status   Specimen Description   Final    BLOOD RIGHT HAND Performed at [facility], [address] [place], [place] [address]    Special Requests   Final    BOTTLES DRAWN AEROBIC AND ANAEROBIC Blood Culture results may not be optimal due to an inadequate volume of blood received in culture bottles Performed at [facility], [address] [place], [place] [address]    Culture   Final    NO GROWTH 5 DAYS Performed at [facility], [address] [place], [place] [address]    Report Status 07/19/2021 FINAL  Final  Blood Culture (routine x 2)     Status: None   Collection Time: 07/14/21 11:48 AM   Specimen: BLOOD  Result Value Ref Range Status   Specimen Description   Final    BLOOD RIGHT ANTECUBITAL Performed at [facility], [address] [place], [place] [address]    Special Requests   Final    BOTTLES DRAWN AEROBIC AND ANAEROBIC Blood Culture results may not be optimal due to an inadequate volume of blood received in culture bottles Performed at [facility], [address] [place], [place] [address]    Culture   Final    NO GROWTH 5 DAYS Performed at [facility], [address] [place], [place] [address]    Report Status 07/19/2021 FINAL  Final  Resp Panel by RT-PCR (Flu A&B, Covid) Peripheral     Status: None    Collection Time: 07/14/21 12:02 PM   Specimen: Peripheral; Nasopharyngeal(NP) swabs in vial transport medium  Result Value Ref Range Status   SARS Coronavirus 2 by RT PCR NEGATIVE NEGATIVE Final    Comment: (NOTE) SARS-CoV-2 target nucleic acids are NOT DETECTED.  The SARS-CoV-2 RNA is generally detectable in upper respiratory specimens during the acute phase of infection. The lowest concentration of SARS-CoV-2 viral copies this assay can detect is 138 copies/mL. A negative result does not preclude SARS-Cov-2 infection and should not be used as the sole basis for treatment or other patient management decisions. A negative result may  occur with  improper specimen collection/handling, submission of specimen other than nasopharyngeal swab, presence of viral mutation(s) within the areas targeted by this assay, and inadequate number of viral copies(<138 copies/mL). A negative result must be combined with clinical observations, patient history, and epidemiological information. The expected result is Negative.  Fact Sheet for Patients:  BloggerCourse.comhttps://www.fda.gov/media/152166/download  Fact Sheet for Healthcare Providers:  SeriousBroker.ithttps://www.fda.gov/media/152162/download  This test is no t yet approved or cleared by the [place] FDA and  has been authorized for detection and/or diagnosis of SARS-CoV-2 by FDA under an Emergency Use Authorization (EUA). This EUA will remain  in effect (meaning this test can be used) for the duration of the COVID-19 declaration under Section 564(b)(1) of the Act, 21 U.S.C.section 360bbb-3(b)(1), unless the authorization is terminated  or revoked sooner.       Influenza A by PCR NEGATIVE NEGATIVE Final   Influenza B by PCR NEGATIVE NEGATIVE Final    Comment: (NOTE) The Xpert Xpress SARS-CoV-2/FLU/RSV plus assay is intended as an aid in the diagnosis of influenza from Nasopharyngeal swab specimens and should not be used as a sole basis for treatment. Nasal  washings and aspirates are unacceptable for Xpert Xpress SARS-CoV-2/FLU/RSV testing.  Fact Sheet for Patients: BloggerCourse.comhttps://www.fda.gov/media/152166/download  Fact Sheet for Healthcare Providers: SeriousBroker.ithttps://www.fda.gov/media/152162/download  This test is not yet approved or cleared by the [place] FDA and has been authorized for detection and/or diagnosis of SARS-CoV-2 by FDA under an Emergency Use Authorization (EUA). This EUA will remain in effect (meaning this test can be used) for the duration of the COVID-19 declaration under Section 564(b)(1) of the Act, 21 U.S.C. section 360bbb-3(b)(1), unless the authorization is terminated or revoked.  Performed at [facility], [address] [place], [place] [address]   Aerobic/Anaerobic Culture w Gram Stain (surgical/deep wound)     Status: None (Preliminary result)   Collection Time: 07/14/21  6:36 PM   Specimen: Soft Tissue, Other  Result Value Ref Range Status   Specimen Description   Final    WOUND Performed at [facility], [address] [place], [place] [address]    Special Requests   Final    LEFT THUMB FLEXOR SHEATH Performed at [facility], [address] [place], [place] [address]    Gram Stain   Final    NO SQUAMOUS EPITHELIAL CELLS SEEN RARE WBC SEEN NO ORGANISMS SEEN Performed at [facility], [address] [place], [place] [address]    Culture   Final    FEW STREPTOCOCCUS PYOGENES Beta hemolytic streptococci are predictably susceptible to penicillin and other beta lactams. Susceptibility testing not routinely performed. NO ANAEROBES ISOLATED; CULTURE IN PROGRESS FOR 5 DAYS    Report Status PENDING  Incomplete    Labs: CBC: Recent Labs  Lab 07/14/21 1105 07/15/21 0326 07/16/21 0319 07/17/21 0348  WBC 14.1* 10.7* 10.7* 7.8  NEUTROABS 11.2*  --   --   --   HGB 14.7 12.2 13.2 12.7  HCT 43.9 37.8 41.2 39.3  MCV 88.0 90.4 90.5 90.8  PLT  127* 116* 150 140*   Basic Metabolic Panel: Recent Labs  Lab 07/14/21 1105 07/15/21 0326  NA 135 138  K 5.1 3.5  CL 103 106  CO2 20* 26  GLUCOSE 108* 147*  BUN 10 9  CREATININE 0.98 0.87  CALCIUM 8.9 8.4*   Liver Function Tests: Recent Labs  Lab 07/14/21 1105 07/15/21 0326  AST 32 17  ALT 19 12  ALKPHOS 74 61  BILITOT 1.4* 0.2*  PROT 7.2 5.9*  ALBUMIN 3.8 3.1*   CBG: No results for input(s): GLUCAP in the last 168 hours.  Discharge time spent: greater than 30 minutes.  Signed: Lynden Oxford, MD [facility]

## 2021-08-03 ENCOUNTER — Other Ambulatory Visit (HOSPITAL_COMMUNITY): Payer: Self-pay

## 2021-08-04 ENCOUNTER — Other Ambulatory Visit (HOSPITAL_COMMUNITY): Payer: Self-pay

## 2021-08-05 ENCOUNTER — Other Ambulatory Visit (HOSPITAL_COMMUNITY): Payer: Self-pay

## 2021-08-08 ENCOUNTER — Other Ambulatory Visit (HOSPITAL_COMMUNITY): Payer: Self-pay

## 2021-08-09 ENCOUNTER — Other Ambulatory Visit (HOSPITAL_COMMUNITY): Payer: Self-pay

## 2021-08-10 ENCOUNTER — Other Ambulatory Visit (HOSPITAL_COMMUNITY): Payer: Self-pay

## 2021-08-10 MED ORDER — BUTALBITAL-APAP-CAFFEINE 50-325-40 MG PO TABS
ORAL_TABLET | ORAL | 5 refills | Status: AC
  Filled 2021-08-10: qty 48, 25d supply, fill #0
  Filled 2021-09-05: qty 48, 25d supply, fill #1
  Filled 2021-09-30: qty 48, 25d supply, fill #2
  Filled 2021-10-25: qty 48, 25d supply, fill #3
  Filled 2021-11-19: qty 48, 25d supply, fill #4
  Filled 2021-12-14: qty 48, 25d supply, fill #5
  Filled 2022-01-09: qty 48, 25d supply, fill #6

## 2021-08-12 ENCOUNTER — Other Ambulatory Visit (HOSPITAL_COMMUNITY): Payer: Self-pay

## 2021-09-05 ENCOUNTER — Other Ambulatory Visit (HOSPITAL_COMMUNITY): Payer: Self-pay

## 2021-09-30 ENCOUNTER — Other Ambulatory Visit (HOSPITAL_COMMUNITY): Payer: Self-pay

## 2021-10-25 ENCOUNTER — Other Ambulatory Visit (HOSPITAL_COMMUNITY): Payer: Self-pay

## 2021-11-19 ENCOUNTER — Other Ambulatory Visit (HOSPITAL_COMMUNITY): Payer: Self-pay

## 2021-12-14 ENCOUNTER — Other Ambulatory Visit (HOSPITAL_COMMUNITY): Payer: Self-pay

## 2022-01-09 ENCOUNTER — Other Ambulatory Visit (HOSPITAL_COMMUNITY): Payer: Self-pay

## 2022-02-06 ENCOUNTER — Other Ambulatory Visit (HOSPITAL_COMMUNITY): Payer: Self-pay

## 2022-02-09 ENCOUNTER — Other Ambulatory Visit (HOSPITAL_COMMUNITY): Payer: Self-pay

## 2022-08-24 ENCOUNTER — Ambulatory Visit (HOSPITAL_COMMUNITY)
Admission: RE | Admit: 2022-08-24 | Discharge: 2022-08-24 | Disposition: A | Discharge status: Home / Self Care | Payer: BC Managed Care – PPO | Source: Ambulatory Visit | Attending: Family Medicine | Admitting: Family Medicine

## 2022-08-24 ENCOUNTER — Other Ambulatory Visit (HOSPITAL_COMMUNITY): Payer: Self-pay | Admitting: Family Medicine

## 2022-08-24 ENCOUNTER — Other Ambulatory Visit (HOSPITAL_BASED_OUTPATIENT_CLINIC_OR_DEPARTMENT_OTHER): Payer: Self-pay | Admitting: Family Medicine

## 2022-08-24 DIAGNOSIS — R109 Unspecified abdominal pain: Secondary | ICD-10-CM | POA: Diagnosis present

## 2023-03-16 ENCOUNTER — Other Ambulatory Visit: Payer: Self-pay | Admitting: Family Medicine

## 2023-03-16 DIAGNOSIS — N83299 Other ovarian cyst, unspecified side: Secondary | ICD-10-CM

## 2024-01-29 IMAGING — CR DG HAND COMPLETE 3+V*L*
3 series · 3 of 3 positions shown · non-contrast
Comparison: 04/22/2011

CLINICAL DATA: Pain and swelling

EXAM:
LEFT HAND - COMPLETE 3+ VIEW

[x hand pa left]
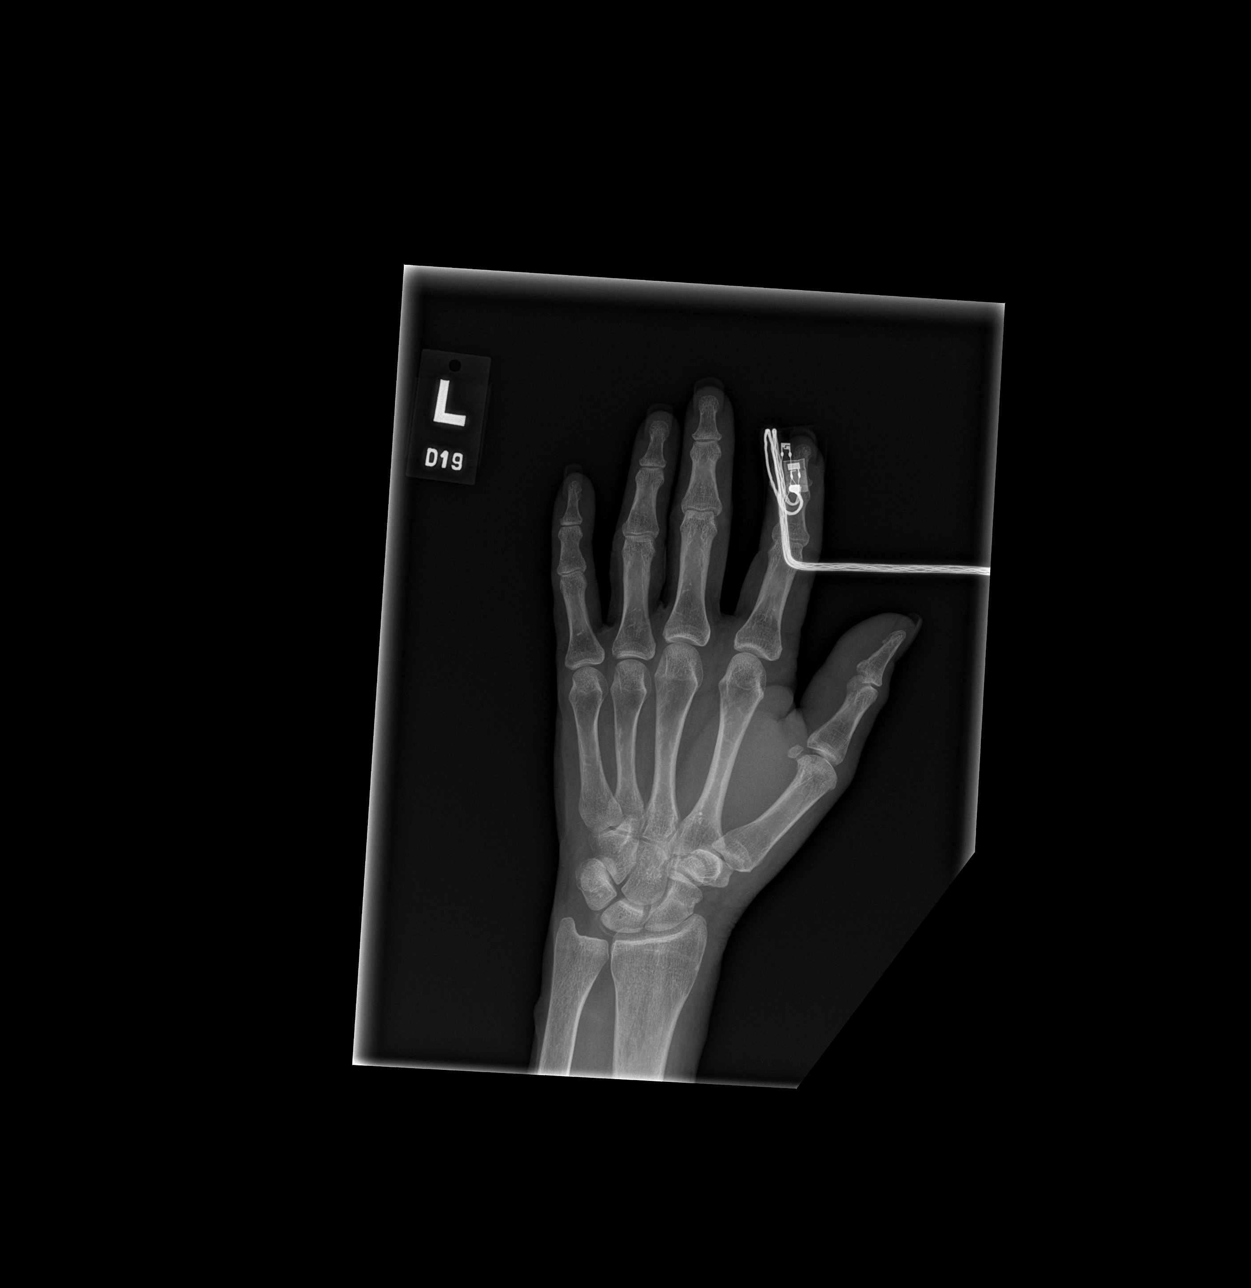

[x hand obl left]
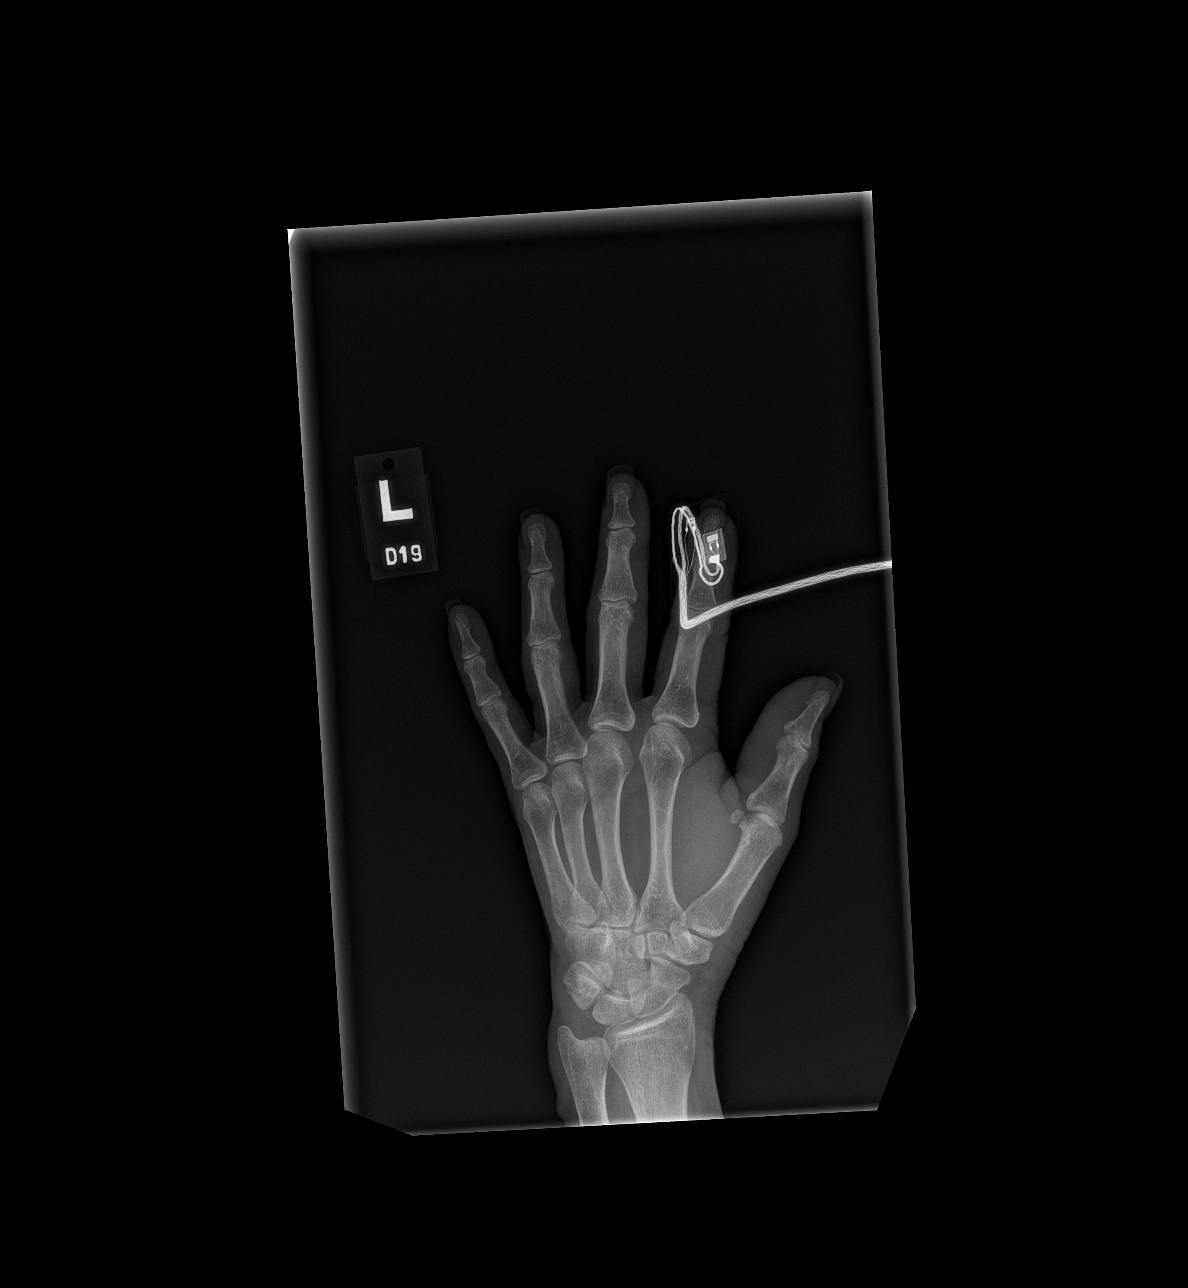

[x hand lat left]
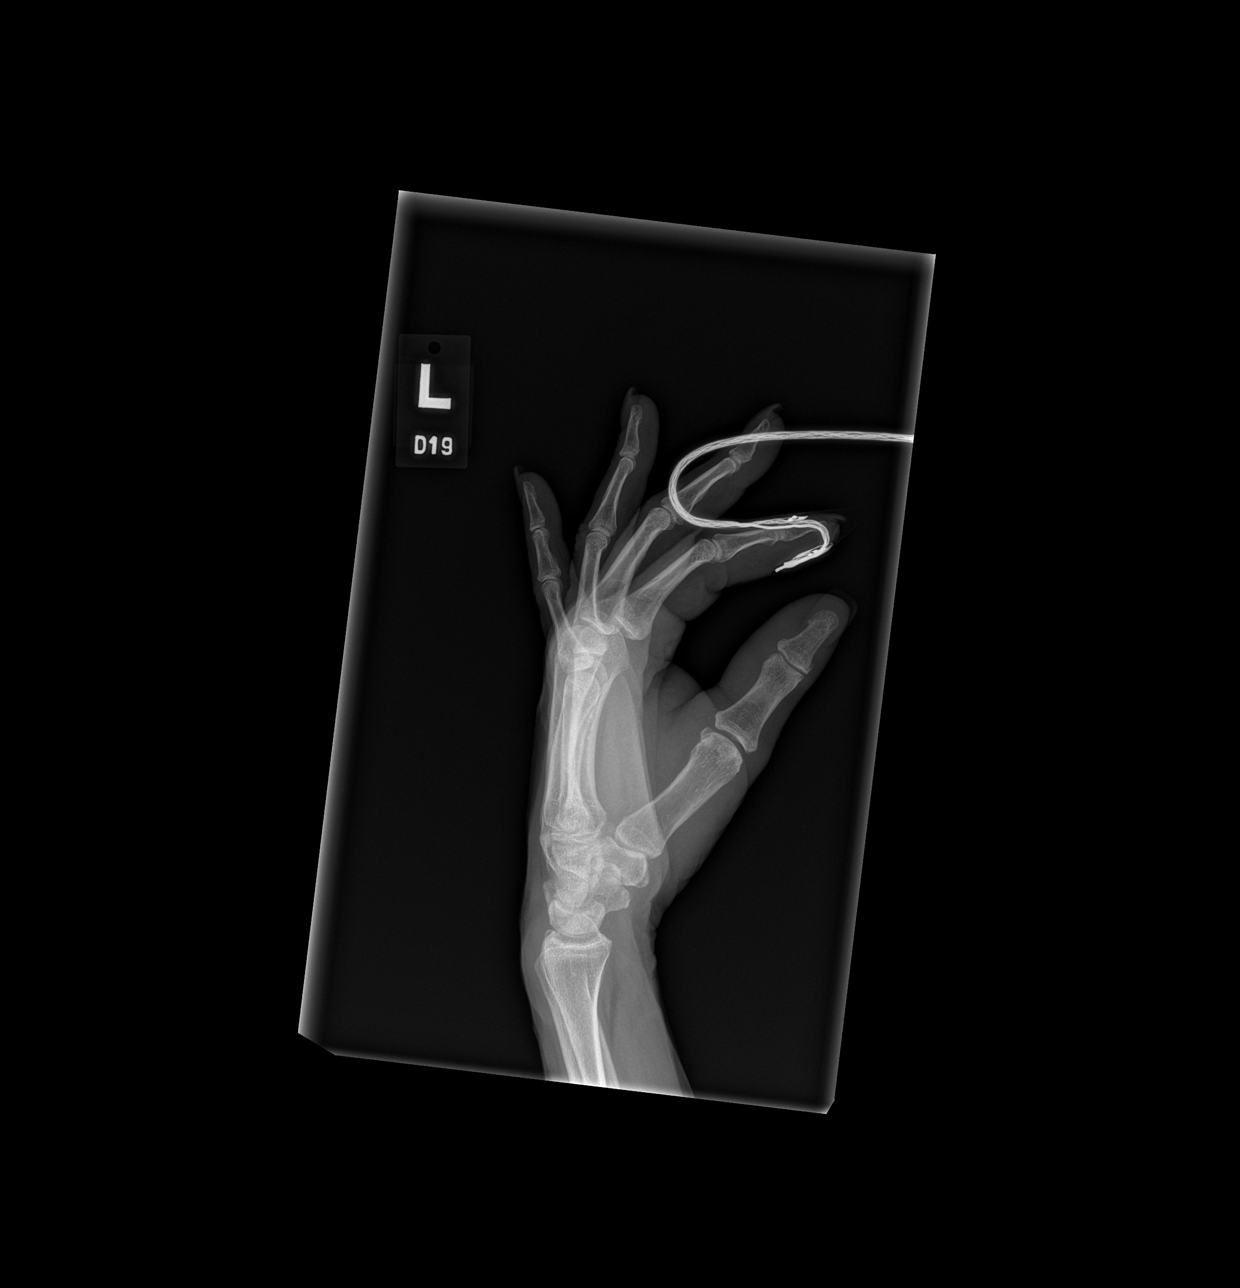

[3 of 3 positions shown; findings below may reference images not displayed]

FINDINGS: No fracture or dislocation is seen. There are no focal lytic
lesions. Possible cartilage calcification is seen in the wrist.
Monitoring device is partly obscuring distal portion of index
finger.
IMPRESSION: No radiographic abnormality is seen in the left hand.
